# Patient Record
Sex: Male | Born: 1955
Health system: Southern US, Community
[De-identification: ages and names within clinical notes are randomized; demographics above are authoritative.]

## PROBLEM LIST (undated history)

## (undated) DIAGNOSIS — F99 Mental disorder, not otherwise specified: Secondary | ICD-10-CM

---

## 2001-12-22 ENCOUNTER — Ambulatory Visit (HOSPITAL_COMMUNITY): Admission: RE | Admit: 2001-12-22 | Discharge: 2001-12-22 | Payer: Self-pay | Admitting: Gastroenterology

## 2013-05-08 ENCOUNTER — Other Ambulatory Visit: Payer: Self-pay | Admitting: Endocrinology

## 2013-05-08 DIAGNOSIS — E0590 Thyrotoxicosis, unspecified without thyrotoxic crisis or storm: Secondary | ICD-10-CM

## 2013-05-15 ENCOUNTER — Ambulatory Visit
Admission: RE | Admit: 2013-05-15 | Discharge: 2013-05-15 | Disposition: A | Discharge status: Home / Self Care | Payer: BC Managed Care – PPO | Source: Ambulatory Visit | Attending: Endocrinology | Admitting: Endocrinology

## 2013-05-15 DIAGNOSIS — E0590 Thyrotoxicosis, unspecified without thyrotoxic crisis or storm: Secondary | ICD-10-CM

## 2013-05-25 ENCOUNTER — Other Ambulatory Visit (HOSPITAL_COMMUNITY): Payer: Self-pay | Admitting: Endocrinology

## 2013-05-25 DIAGNOSIS — E0590 Thyrotoxicosis, unspecified without thyrotoxic crisis or storm: Secondary | ICD-10-CM

## 2013-06-12 ENCOUNTER — Ambulatory Visit (HOSPITAL_COMMUNITY): Payer: BC Managed Care – PPO

## 2013-06-13 ENCOUNTER — Encounter (HOSPITAL_COMMUNITY): Payer: BC Managed Care – PPO

## 2013-06-13 ENCOUNTER — Encounter (HOSPITAL_COMMUNITY)
Admission: RE | Admit: 2013-06-13 | Discharge: 2013-06-13 | Disposition: A | Discharge status: Home / Self Care | Payer: BC Managed Care – PPO | Source: Ambulatory Visit | Attending: Endocrinology | Admitting: Endocrinology

## 2013-06-13 ENCOUNTER — Other Ambulatory Visit (HOSPITAL_COMMUNITY): Payer: BC Managed Care – PPO

## 2013-06-14 ENCOUNTER — Encounter (HOSPITAL_COMMUNITY): Payer: BC Managed Care – PPO

## 2015-09-16 DIAGNOSIS — E0590 Thyrotoxicosis, unspecified without thyrotoxic crisis or storm: Secondary | ICD-10-CM | POA: Diagnosis not present

## 2015-09-16 DIAGNOSIS — Z125 Encounter for screening for malignant neoplasm of prostate: Secondary | ICD-10-CM | POA: Diagnosis not present

## 2015-09-16 DIAGNOSIS — I444 Left anterior fascicular block: Secondary | ICD-10-CM | POA: Diagnosis not present

## 2015-09-22 DIAGNOSIS — R5383 Other fatigue: Secondary | ICD-10-CM | POA: Diagnosis not present

## 2015-09-22 DIAGNOSIS — E0590 Thyrotoxicosis, unspecified without thyrotoxic crisis or storm: Secondary | ICD-10-CM | POA: Diagnosis not present

## 2015-09-22 DIAGNOSIS — N3281 Overactive bladder: Secondary | ICD-10-CM | POA: Diagnosis not present

## 2015-09-22 DIAGNOSIS — Z0000 Encounter for general adult medical examination without abnormal findings: Secondary | ICD-10-CM | POA: Diagnosis not present

## 2015-09-22 DIAGNOSIS — R351 Nocturia: Secondary | ICD-10-CM | POA: Diagnosis not present

## 2015-09-22 DIAGNOSIS — I444 Left anterior fascicular block: Secondary | ICD-10-CM | POA: Diagnosis not present

## 2015-09-22 DIAGNOSIS — G250 Essential tremor: Secondary | ICD-10-CM | POA: Diagnosis not present

## 2015-09-22 DIAGNOSIS — L708 Other acne: Secondary | ICD-10-CM | POA: Diagnosis not present

## 2015-09-22 DIAGNOSIS — F79 Unspecified intellectual disabilities: Secondary | ICD-10-CM | POA: Diagnosis not present

## 2015-09-22 DIAGNOSIS — R634 Abnormal weight loss: Secondary | ICD-10-CM | POA: Diagnosis not present

## 2015-11-21 DIAGNOSIS — Z0000 Encounter for general adult medical examination without abnormal findings: Secondary | ICD-10-CM | POA: Diagnosis not present

## 2015-11-21 DIAGNOSIS — N3281 Overactive bladder: Secondary | ICD-10-CM | POA: Diagnosis not present

## 2015-11-21 DIAGNOSIS — R351 Nocturia: Secondary | ICD-10-CM | POA: Diagnosis not present

## 2016-08-10 DIAGNOSIS — R509 Fever, unspecified: Secondary | ICD-10-CM | POA: Diagnosis not present

## 2016-08-10 DIAGNOSIS — J111 Influenza due to unidentified influenza virus with other respiratory manifestations: Secondary | ICD-10-CM | POA: Diagnosis not present

## 2016-10-12 DIAGNOSIS — I444 Left anterior fascicular block: Secondary | ICD-10-CM | POA: Diagnosis not present

## 2016-10-12 DIAGNOSIS — E0590 Thyrotoxicosis, unspecified without thyrotoxic crisis or storm: Secondary | ICD-10-CM | POA: Diagnosis not present

## 2016-10-12 DIAGNOSIS — Z125 Encounter for screening for malignant neoplasm of prostate: Secondary | ICD-10-CM | POA: Diagnosis not present

## 2016-10-19 DIAGNOSIS — H9193 Unspecified hearing loss, bilateral: Secondary | ICD-10-CM | POA: Diagnosis not present

## 2016-10-19 DIAGNOSIS — Z0000 Encounter for general adult medical examination without abnormal findings: Secondary | ICD-10-CM | POA: Diagnosis not present

## 2016-10-19 DIAGNOSIS — G250 Essential tremor: Secondary | ICD-10-CM | POA: Diagnosis not present

## 2016-10-19 DIAGNOSIS — R634 Abnormal weight loss: Secondary | ICD-10-CM | POA: Diagnosis not present

## 2016-12-27 DIAGNOSIS — L218 Other seborrheic dermatitis: Secondary | ICD-10-CM | POA: Diagnosis not present

## 2016-12-27 DIAGNOSIS — L718 Other rosacea: Secondary | ICD-10-CM | POA: Diagnosis not present

## 2017-01-24 DIAGNOSIS — R351 Nocturia: Secondary | ICD-10-CM | POA: Diagnosis not present

## 2017-01-24 DIAGNOSIS — N3281 Overactive bladder: Secondary | ICD-10-CM | POA: Diagnosis not present

## 2017-03-04 DIAGNOSIS — R159 Full incontinence of feces: Secondary | ICD-10-CM | POA: Diagnosis not present

## 2017-03-04 DIAGNOSIS — R634 Abnormal weight loss: Secondary | ICD-10-CM | POA: Diagnosis not present

## 2017-03-04 DIAGNOSIS — R194 Change in bowel habit: Secondary | ICD-10-CM | POA: Diagnosis not present

## 2017-03-04 DIAGNOSIS — Z23 Encounter for immunization: Secondary | ICD-10-CM | POA: Diagnosis not present

## 2017-04-14 DIAGNOSIS — H1013 Acute atopic conjunctivitis, bilateral: Secondary | ICD-10-CM | POA: Diagnosis not present

## 2017-04-14 DIAGNOSIS — Z6823 Body mass index (BMI) 23.0-23.9, adult: Secondary | ICD-10-CM | POA: Diagnosis not present

## 2017-05-25 DIAGNOSIS — H1033 Unspecified acute conjunctivitis, bilateral: Secondary | ICD-10-CM | POA: Diagnosis not present

## 2017-05-30 DIAGNOSIS — H1033 Unspecified acute conjunctivitis, bilateral: Secondary | ICD-10-CM | POA: Diagnosis not present

## 2017-06-17 DIAGNOSIS — H1033 Unspecified acute conjunctivitis, bilateral: Secondary | ICD-10-CM | POA: Diagnosis not present

## 2017-09-02 DIAGNOSIS — L718 Other rosacea: Secondary | ICD-10-CM | POA: Diagnosis not present

## 2017-09-02 DIAGNOSIS — L570 Actinic keratosis: Secondary | ICD-10-CM | POA: Diagnosis not present

## 2017-09-02 DIAGNOSIS — L218 Other seborrheic dermatitis: Secondary | ICD-10-CM | POA: Diagnosis not present

## 2017-09-15 DIAGNOSIS — R634 Abnormal weight loss: Secondary | ICD-10-CM | POA: Diagnosis not present

## 2017-09-15 DIAGNOSIS — F79 Unspecified intellectual disabilities: Secondary | ICD-10-CM | POA: Diagnosis not present

## 2017-09-15 DIAGNOSIS — R159 Full incontinence of feces: Secondary | ICD-10-CM | POA: Diagnosis not present

## 2017-09-15 DIAGNOSIS — E0590 Thyrotoxicosis, unspecified without thyrotoxic crisis or storm: Secondary | ICD-10-CM | POA: Diagnosis not present

## 2017-11-07 DIAGNOSIS — L718 Other rosacea: Secondary | ICD-10-CM | POA: Diagnosis not present

## 2017-11-07 DIAGNOSIS — L218 Other seborrheic dermatitis: Secondary | ICD-10-CM | POA: Diagnosis not present

## 2017-11-08 DIAGNOSIS — L03031 Cellulitis of right toe: Secondary | ICD-10-CM | POA: Diagnosis not present

## 2017-11-08 DIAGNOSIS — L84 Corns and callosities: Secondary | ICD-10-CM | POA: Diagnosis not present

## 2017-11-08 DIAGNOSIS — I739 Peripheral vascular disease, unspecified: Secondary | ICD-10-CM | POA: Diagnosis not present

## 2017-11-08 DIAGNOSIS — L03032 Cellulitis of left toe: Secondary | ICD-10-CM | POA: Diagnosis not present

## 2017-11-22 DIAGNOSIS — E0590 Thyrotoxicosis, unspecified without thyrotoxic crisis or storm: Secondary | ICD-10-CM | POA: Diagnosis not present

## 2017-11-22 DIAGNOSIS — Z79899 Other long term (current) drug therapy: Secondary | ICD-10-CM | POA: Diagnosis not present

## 2017-11-22 DIAGNOSIS — Z125 Encounter for screening for malignant neoplasm of prostate: Secondary | ICD-10-CM | POA: Diagnosis not present

## 2017-11-22 DIAGNOSIS — R82998 Other abnormal findings in urine: Secondary | ICD-10-CM | POA: Diagnosis not present

## 2017-11-23 DIAGNOSIS — E0590 Thyrotoxicosis, unspecified without thyrotoxic crisis or storm: Secondary | ICD-10-CM | POA: Diagnosis not present

## 2017-11-23 DIAGNOSIS — Z0000 Encounter for general adult medical examination without abnormal findings: Secondary | ICD-10-CM | POA: Diagnosis not present

## 2017-11-23 DIAGNOSIS — H6122 Impacted cerumen, left ear: Secondary | ICD-10-CM | POA: Diagnosis not present

## 2017-11-23 DIAGNOSIS — H9192 Unspecified hearing loss, left ear: Secondary | ICD-10-CM | POA: Diagnosis not present

## 2018-02-09 DIAGNOSIS — H2513 Age-related nuclear cataract, bilateral: Secondary | ICD-10-CM | POA: Diagnosis not present

## 2018-03-23 DIAGNOSIS — H10023 Other mucopurulent conjunctivitis, bilateral: Secondary | ICD-10-CM | POA: Diagnosis not present

## 2018-08-01 DIAGNOSIS — Z0120 Encounter for dental examination and cleaning without abnormal findings: Secondary | ICD-10-CM | POA: Diagnosis not present

## 2018-12-14 DIAGNOSIS — E0590 Thyrotoxicosis, unspecified without thyrotoxic crisis or storm: Secondary | ICD-10-CM | POA: Diagnosis not present

## 2018-12-14 DIAGNOSIS — Z125 Encounter for screening for malignant neoplasm of prostate: Secondary | ICD-10-CM | POA: Diagnosis not present

## 2018-12-14 DIAGNOSIS — E7849 Other hyperlipidemia: Secondary | ICD-10-CM | POA: Diagnosis not present

## 2018-12-20 DIAGNOSIS — R82998 Other abnormal findings in urine: Secondary | ICD-10-CM | POA: Diagnosis not present

## 2018-12-20 DIAGNOSIS — R159 Full incontinence of feces: Secondary | ICD-10-CM | POA: Diagnosis not present

## 2018-12-20 DIAGNOSIS — I444 Left anterior fascicular block: Secondary | ICD-10-CM | POA: Diagnosis not present

## 2018-12-20 DIAGNOSIS — Z0000 Encounter for general adult medical examination without abnormal findings: Secondary | ICD-10-CM | POA: Diagnosis not present

## 2018-12-20 DIAGNOSIS — R351 Nocturia: Secondary | ICD-10-CM | POA: Diagnosis not present

## 2019-02-13 DIAGNOSIS — Z0120 Encounter for dental examination and cleaning without abnormal findings: Secondary | ICD-10-CM | POA: Diagnosis not present

## 2019-03-23 DIAGNOSIS — Z23 Encounter for immunization: Secondary | ICD-10-CM | POA: Diagnosis not present

## 2019-04-11 DIAGNOSIS — Z20828 Contact with and (suspected) exposure to other viral communicable diseases: Secondary | ICD-10-CM | POA: Diagnosis not present

## 2019-04-23 ENCOUNTER — Other Ambulatory Visit: Payer: Self-pay

## 2019-04-23 DIAGNOSIS — Z20822 Contact with and (suspected) exposure to covid-19: Secondary | ICD-10-CM

## 2019-04-25 LAB — NOVEL CORONAVIRUS, NAA: SARS-CoV-2, NAA: NOT DETECTED

## 2019-07-12 DIAGNOSIS — L011 Impetiginization of other dermatoses: Secondary | ICD-10-CM | POA: Diagnosis not present

## 2019-07-12 DIAGNOSIS — L249 Irritant contact dermatitis, unspecified cause: Secondary | ICD-10-CM | POA: Diagnosis not present

## 2019-08-17 DIAGNOSIS — L0109 Other impetigo: Secondary | ICD-10-CM | POA: Diagnosis not present

## 2019-08-17 DIAGNOSIS — L249 Irritant contact dermatitis, unspecified cause: Secondary | ICD-10-CM | POA: Diagnosis not present

## 2020-01-24 DIAGNOSIS — L011 Impetiginization of other dermatoses: Secondary | ICD-10-CM | POA: Diagnosis not present

## 2020-01-24 DIAGNOSIS — L239 Allergic contact dermatitis, unspecified cause: Secondary | ICD-10-CM | POA: Diagnosis not present

## 2020-01-24 DIAGNOSIS — L2084 Intrinsic (allergic) eczema: Secondary | ICD-10-CM | POA: Diagnosis not present

## 2020-02-01 DIAGNOSIS — E0590 Thyrotoxicosis, unspecified without thyrotoxic crisis or storm: Secondary | ICD-10-CM | POA: Diagnosis not present

## 2020-02-01 DIAGNOSIS — E7849 Other hyperlipidemia: Secondary | ICD-10-CM | POA: Diagnosis not present

## 2020-02-01 DIAGNOSIS — R7301 Impaired fasting glucose: Secondary | ICD-10-CM | POA: Diagnosis not present

## 2020-02-01 DIAGNOSIS — Z0000 Encounter for general adult medical examination without abnormal findings: Secondary | ICD-10-CM | POA: Diagnosis not present

## 2020-02-04 DIAGNOSIS — R82998 Other abnormal findings in urine: Secondary | ICD-10-CM | POA: Diagnosis not present

## 2020-02-08 ENCOUNTER — Other Ambulatory Visit: Payer: Self-pay | Admitting: Internal Medicine

## 2020-02-08 DIAGNOSIS — Z0000 Encounter for general adult medical examination without abnormal findings: Secondary | ICD-10-CM | POA: Diagnosis not present

## 2020-02-08 DIAGNOSIS — E785 Hyperlipidemia, unspecified: Secondary | ICD-10-CM

## 2020-02-08 DIAGNOSIS — Z1212 Encounter for screening for malignant neoplasm of rectum: Secondary | ICD-10-CM | POA: Diagnosis not present

## 2020-02-08 DIAGNOSIS — R5383 Other fatigue: Secondary | ICD-10-CM | POA: Diagnosis not present

## 2020-02-08 DIAGNOSIS — R32 Unspecified urinary incontinence: Secondary | ICD-10-CM | POA: Diagnosis not present

## 2020-02-08 DIAGNOSIS — R7301 Impaired fasting glucose: Secondary | ICD-10-CM | POA: Diagnosis not present

## 2020-02-27 ENCOUNTER — Ambulatory Visit
Admission: RE | Admit: 2020-02-27 | Discharge: 2020-02-27 | Disposition: A | Discharge status: Home / Self Care | Payer: No Typology Code available for payment source | Source: Ambulatory Visit | Attending: Internal Medicine | Admitting: Internal Medicine

## 2020-02-27 DIAGNOSIS — E785 Hyperlipidemia, unspecified: Secondary | ICD-10-CM

## 2020-04-15 DIAGNOSIS — H5213 Myopia, bilateral: Secondary | ICD-10-CM | POA: Diagnosis not present

## 2020-04-15 DIAGNOSIS — H2513 Age-related nuclear cataract, bilateral: Secondary | ICD-10-CM | POA: Diagnosis not present

## 2020-05-05 DIAGNOSIS — J029 Acute pharyngitis, unspecified: Secondary | ICD-10-CM | POA: Diagnosis not present

## 2020-05-05 DIAGNOSIS — R5383 Other fatigue: Secondary | ICD-10-CM | POA: Diagnosis not present

## 2020-05-05 DIAGNOSIS — N3001 Acute cystitis with hematuria: Secondary | ICD-10-CM | POA: Diagnosis not present

## 2020-05-05 DIAGNOSIS — N390 Urinary tract infection, site not specified: Secondary | ICD-10-CM | POA: Diagnosis not present

## 2020-05-05 DIAGNOSIS — D72829 Elevated white blood cell count, unspecified: Secondary | ICD-10-CM | POA: Diagnosis not present

## 2020-05-07 ENCOUNTER — Inpatient Hospital Stay (HOSPITAL_COMMUNITY)
Admission: EM | Admit: 2020-05-07 | Discharge: 2020-05-12 | DRG: 871 | Disposition: A | Discharge status: Home Health | Payer: Medicare Other | Attending: Internal Medicine | Admitting: Internal Medicine

## 2020-05-07 ENCOUNTER — Other Ambulatory Visit: Payer: Self-pay

## 2020-05-07 ENCOUNTER — Emergency Department (HOSPITAL_COMMUNITY): Payer: Medicare Other

## 2020-05-07 ENCOUNTER — Encounter (HOSPITAL_COMMUNITY): Payer: Self-pay

## 2020-05-07 DIAGNOSIS — R338 Other retention of urine: Secondary | ICD-10-CM | POA: Diagnosis not present

## 2020-05-07 DIAGNOSIS — R32 Unspecified urinary incontinence: Secondary | ICD-10-CM | POA: Diagnosis present

## 2020-05-07 DIAGNOSIS — R6520 Severe sepsis without septic shock: Secondary | ICD-10-CM | POA: Diagnosis not present

## 2020-05-07 DIAGNOSIS — E872 Acidosis: Secondary | ICD-10-CM | POA: Diagnosis present

## 2020-05-07 DIAGNOSIS — N400 Enlarged prostate without lower urinary tract symptoms: Secondary | ICD-10-CM | POA: Diagnosis not present

## 2020-05-07 DIAGNOSIS — R748 Abnormal levels of other serum enzymes: Secondary | ICD-10-CM | POA: Diagnosis not present

## 2020-05-07 DIAGNOSIS — F79 Unspecified intellectual disabilities: Secondary | ICD-10-CM | POA: Diagnosis present

## 2020-05-07 DIAGNOSIS — F419 Anxiety disorder, unspecified: Secondary | ICD-10-CM | POA: Diagnosis present

## 2020-05-07 DIAGNOSIS — N138 Other obstructive and reflux uropathy: Secondary | ICD-10-CM | POA: Diagnosis present

## 2020-05-07 DIAGNOSIS — N139 Obstructive and reflux uropathy, unspecified: Secondary | ICD-10-CM

## 2020-05-07 DIAGNOSIS — R000 Tachycardia, unspecified: Secondary | ICD-10-CM | POA: Diagnosis not present

## 2020-05-07 DIAGNOSIS — R5381 Other malaise: Secondary | ICD-10-CM | POA: Diagnosis not present

## 2020-05-07 DIAGNOSIS — Z20822 Contact with and (suspected) exposure to covid-19: Secondary | ICD-10-CM | POA: Diagnosis present

## 2020-05-07 DIAGNOSIS — N19 Unspecified kidney failure: Secondary | ICD-10-CM | POA: Diagnosis not present

## 2020-05-07 DIAGNOSIS — R6521 Severe sepsis with septic shock: Secondary | ICD-10-CM | POA: Diagnosis present

## 2020-05-07 DIAGNOSIS — N390 Urinary tract infection, site not specified: Secondary | ICD-10-CM | POA: Diagnosis not present

## 2020-05-07 DIAGNOSIS — R911 Solitary pulmonary nodule: Secondary | ICD-10-CM | POA: Diagnosis not present

## 2020-05-07 DIAGNOSIS — N401 Enlarged prostate with lower urinary tract symptoms: Secondary | ICD-10-CM | POA: Diagnosis not present

## 2020-05-07 DIAGNOSIS — A419 Sepsis, unspecified organism: Secondary | ICD-10-CM | POA: Diagnosis not present

## 2020-05-07 DIAGNOSIS — K7689 Other specified diseases of liver: Secondary | ICD-10-CM | POA: Diagnosis not present

## 2020-05-07 DIAGNOSIS — F32A Depression, unspecified: Secondary | ICD-10-CM | POA: Diagnosis not present

## 2020-05-07 DIAGNOSIS — R7989 Other specified abnormal findings of blood chemistry: Secondary | ICD-10-CM | POA: Diagnosis not present

## 2020-05-07 DIAGNOSIS — R945 Abnormal results of liver function studies: Secondary | ICD-10-CM | POA: Diagnosis not present

## 2020-05-07 DIAGNOSIS — N170 Acute kidney failure with tubular necrosis: Secondary | ICD-10-CM | POA: Diagnosis present

## 2020-05-07 DIAGNOSIS — N179 Acute kidney failure, unspecified: Secondary | ICD-10-CM | POA: Diagnosis not present

## 2020-05-07 HISTORY — DX: Mental disorder, not otherwise specified: F99

## 2020-05-07 LAB — CBC WITH DIFFERENTIAL/PLATELET
Abs Immature Granulocytes: 0.68 10*3/uL — ABNORMAL HIGH (ref 0.00–0.07)
Basophils Absolute: 0.1 10*3/uL (ref 0.0–0.1)
Basophils Relative: 1 %
Eosinophils Absolute: 0 10*3/uL (ref 0.0–0.5)
Eosinophils Relative: 0 %
HCT: 46.9 % (ref 39.0–52.0)
Hemoglobin: 15.1 g/dL (ref 13.0–17.0)
Immature Granulocytes: 4 %
Lymphocytes Relative: 5 %
Lymphs Abs: 0.8 10*3/uL (ref 0.7–4.0)
MCH: 30.9 pg (ref 26.0–34.0)
MCHC: 32.2 g/dL (ref 30.0–36.0)
MCV: 95.9 fL (ref 80.0–100.0)
Monocytes Absolute: 2.2 10*3/uL — ABNORMAL HIGH (ref 0.1–1.0)
Monocytes Relative: 12 %
Neutro Abs: 14.6 10*3/uL — ABNORMAL HIGH (ref 1.7–7.7)
Neutrophils Relative %: 78 %
Platelets: 145 10*3/uL — ABNORMAL LOW (ref 150–400)
RBC: 4.89 MIL/uL (ref 4.22–5.81)
RDW: 13.7 % (ref 11.5–15.5)
WBC: 18.3 10*3/uL — ABNORMAL HIGH (ref 4.0–10.5)
nRBC: 0 % (ref 0.0–0.2)

## 2020-05-07 LAB — RESP PANEL BY RT PCR (RSV, FLU A&B, COVID)
Influenza A by PCR: NEGATIVE
Influenza B by PCR: NEGATIVE
Respiratory Syncytial Virus by PCR: NEGATIVE
SARS Coronavirus 2 by RT PCR: NEGATIVE

## 2020-05-07 LAB — COMPREHENSIVE METABOLIC PANEL
ALT: 52 U/L — ABNORMAL HIGH (ref 0–44)
AST: 42 U/L — ABNORMAL HIGH (ref 15–41)
Albumin: 3.1 g/dL — ABNORMAL LOW (ref 3.5–5.0)
Alkaline Phosphatase: 111 U/L (ref 38–126)
Anion gap: 16 — ABNORMAL HIGH (ref 5–15)
BUN: 83 mg/dL — ABNORMAL HIGH (ref 8–23)
CO2: 20 mmol/L — ABNORMAL LOW (ref 22–32)
Calcium: 8.8 mg/dL — ABNORMAL LOW (ref 8.9–10.3)
Chloride: 97 mmol/L — ABNORMAL LOW (ref 98–111)
Creatinine, Ser: 2.82 mg/dL — ABNORMAL HIGH (ref 0.61–1.24)
GFR, Estimated: 24 mL/min — ABNORMAL LOW (ref >60)
Glucose, Bld: 134 mg/dL — ABNORMAL HIGH (ref 70–99)
Potassium: 4.1 mmol/L (ref 3.5–5.1)
Sodium: 133 mmol/L — ABNORMAL LOW (ref 135–145)
Total Bilirubin: 0.9 mg/dL (ref 0.3–1.2)
Total Protein: 6.7 g/dL (ref 6.5–8.1)

## 2020-05-07 LAB — URINALYSIS, COMPLETE (UACMP) WITH MICROSCOPIC
Bilirubin Urine: NEGATIVE
Glucose, UA: NEGATIVE mg/dL
Ketones, ur: NEGATIVE mg/dL
Nitrite: NEGATIVE
Protein, ur: NEGATIVE mg/dL
Specific Gravity, Urine: 1.016 (ref 1.005–1.030)
pH: 5 (ref 5.0–8.0)

## 2020-05-07 LAB — LACTIC ACID, PLASMA
Lactic Acid, Venous: 4 mmol/L (ref 0.5–1.9)
Lactic Acid, Venous: 2 mmol/L (ref 0.5–1.9)

## 2020-05-07 LAB — APTT: aPTT: 27 seconds (ref 24–36)

## 2020-05-07 LAB — PROTIME-INR
INR: 1.2 (ref 0.8–1.2)
Prothrombin Time: 14.3 seconds (ref 11.4–15.2)

## 2020-05-07 MED ORDER — SODIUM CHLORIDE 0.9 % IV BOLUS (SEPSIS)
1000.0000 mL | Freq: Once | INTRAVENOUS | Status: AC
  Administered 2020-05-07: 1000 mL via INTRAVENOUS

## 2020-05-07 MED ORDER — ACETAMINOPHEN 500 MG PO TABS
1000.0000 mg | ORAL_TABLET | Freq: Once | ORAL | Status: AC
  Administered 2020-05-07: 1000 mg via ORAL
  Filled 2020-05-07: qty 2

## 2020-05-07 MED ORDER — SODIUM CHLORIDE 0.9 % IV SOLN
1.0000 g | INTRAVENOUS | Status: DC
  Administered 2020-05-07 – 2020-05-11 (×5): 1 g via INTRAVENOUS
  Filled 2020-05-07 (×5): qty 10

## 2020-05-07 MED ORDER — SODIUM CHLORIDE 0.9 % IV BOLUS (SEPSIS)
250.0000 mL | Freq: Once | INTRAVENOUS | Status: AC
  Administered 2020-05-07: 250 mL via INTRAVENOUS

## 2020-05-07 MED ORDER — LACTATED RINGERS IV SOLN: INTRAVENOUS | Status: DC

## 2020-05-07 NOTE — ED Provider Notes (Signed)
[facility]-EMERGENCY DEPT Provider Note   CSN: [id number] Arrival date & time: 05/07/20  1745     History Chief Complaint  Patient presents with  . Abnormal Lab    Paul Moreno is a 64 y.o. male with a past medical history of developmental delay.  He is attended by his sister who gives the history reviewed. The patient lives in the group home.  He is vaccinated against the coronavirus.  She states that she meets him at church every Sunday and noticed that he seemed extremely weak.  He has not been eating or drinking well and has had shaking chills.  She states that he has not had a reported fever at his group home.  She noticed that he seemed extremely weak however he frequently will answer that he feels okay even if he is in significant pain.  He was seen by Dr. Waynard Edwards this past Monday who took lab work and called him to come for evaluation due to elevated creatinine and white blood cell count of 38,000.  Patient is unable to provide any further history HPI     Past Medical History:  Diagnosis Date  . Mental disorder     There are no problems to display for this patient.   History reviewed. No pertinent surgical history.     Family History  Problem Relation Age of Onset  . Diabetes Father   . Heart failure Father     Social History   Tobacco Use  . Smoking status: Never Smoker  . Smokeless tobacco: Never Used  Vaping Use  . Vaping Use: Never used  Substance Use Topics  . Alcohol use: Never  . Drug use: Never    Home Medications Prior to Admission medications   Medication Sig Start Date End Date Taking? Authorizing Provider  acetaminophen (TYLENOL) 500 MG tablet Take 500 mg by mouth every 6 (six) hours as needed for moderate pain.   Yes [provider]  augmented betamethasone dipropionate (DIPROLENE-AF) 0.05 % cream Apply 1 application topically 2 (two) times daily as needed (dry skin).  04/04/20  Yes [provider]   cefdinir (OMNICEF) 300 MG capsule Take 300 mg by mouth 2 (two) times daily.  05/05/20  Yes [provider]  fluticasone (FLONASE) 50 MCG/ACT nasal spray Place 1 spray into both nostrils daily.  04/12/20  Yes [provider]  ketoconazole (NIZORAL) 2 % cream Apply 1 application topically at bedtime.  05/07/20  Yes [provider]  ketoconazole (NIZORAL) 2 % shampoo Apply 1 application topically 3 (three) times a week. On MWF 05/07/20  Yes [provider]  metroNIDAZOLE (METROCREAM) 0.75 % cream Apply 1 application topically at bedtime.   Yes [provider]  mirtazapine (REMERON) 15 MG tablet Take 15 mg by mouth at bedtime. 05/01/20  Yes [provider]  MYRBETRIQ 50 MG TB24 tablet Take 50 mg by mouth daily. 05/01/20  Yes [provider]  OLANZapine (ZYPREXA) 2.5 MG tablet Take 2.5 mg by mouth at bedtime. 05/01/20  Yes [provider]  tamsulosin (FLOMAX) 0.4 MG CAPS capsule Take 0.4 mg by mouth daily. 05/01/20  Yes [provider]  TOVIAZ 4 MG TB24 tablet Take 4 mg by mouth daily. 05/01/20  Yes [provider]    Allergies    Patient has no known allergies.  Review of Systems   Review of Systems Ten systems reviewed and are negative for acute change, except as noted in the HPI.  Physical  Exam Updated Vital Signs BP 102/77   Pulse 98   Temp (!) 100.5 F (38.1 C) (Rectal)   Resp 18   Ht 5\' 10"  (1.778 m)   Wt 72.6 kg   SpO2 95%   BMI 22.96 kg/m   Physical Exam Vitals and nursing note reviewed.  Constitutional:      General: He is not in acute distress.    Appearance: He is well-developed. He is ill-appearing. He is not toxic-appearing or diaphoretic.     Comments: Patient appears pale  HENT:     Head: Normocephalic and atraumatic.  Eyes:     General: No scleral icterus.    Conjunctiva/sclera: Conjunctivae normal.  Cardiovascular:     Rate and Rhythm: Normal rate and regular rhythm.      Heart sounds: Normal heart sounds.  Pulmonary:     Effort: Pulmonary effort is normal. No respiratory distress.     Breath sounds: Normal breath sounds.  Abdominal:     General: There is distension.     Palpations: Abdomen is soft.     Tenderness: There is no abdominal tenderness.     Comments: Lower abdomen is firm and distended.  Suspect bladder distention.  Musculoskeletal:     Cervical back: Normal range of motion and neck supple.  Skin:    General: Skin is warm and dry.  Neurological:     Mental Status: He is alert.  Psychiatric:        Behavior: Behavior normal.     ED Results / Procedures / Treatments   Labs (all labs ordered are listed, but only abnormal results are displayed) Labs Reviewed  LACTIC ACID, PLASMA - Abnormal; Notable for the following components:      Result Value   Lactic Acid, Venous 4.0 (*)    All other components within normal limits  LACTIC ACID, PLASMA - Abnormal; Notable for the following components:   Lactic Acid, Venous 2.0 (*)    All other components within normal limits  COMPREHENSIVE METABOLIC PANEL - Abnormal; Notable for the following components:   Sodium 133 (*)    Chloride 97 (*)    CO2 20 (*)    Glucose, Bld 134 (*)    BUN 83 (*)    Creatinine, Ser 2.82 (*)    Calcium 8.8 (*)    Albumin 3.1 (*)    AST 42 (*)    ALT 52 (*)    GFR, Estimated 24 (*)    Anion gap 16 (*)    All other components within normal limits  CBC WITH DIFFERENTIAL/PLATELET - Abnormal; Notable for the following components:   WBC 18.3 (*)    Platelets 145 (*)    Neutro Abs 14.6 (*)    Monocytes Absolute 2.2 (*)    Abs Immature Granulocytes 0.68 (*)    All other components within normal limits  URINALYSIS, COMPLETE (UACMP) WITH MICROSCOPIC - Abnormal; Notable for the following components:   APPearance HAZY (*)    Hgb urine dipstick MODERATE (*)    Leukocytes,Ua SMALL (*)    Bacteria, UA FEW (*)    All other components within normal limits  RESP PANEL BY  RT PCR (RSV, FLU A&B, COVID)  CULTURE, BLOOD (ROUTINE X 2)  CULTURE, BLOOD (ROUTINE X 2)  URINE CULTURE  PROTIME-INR  APTT  URINALYSIS, ROUTINE W REFLEX MICROSCOPIC    EKG EKG Interpretation  Date/Time:  Wednesday May 07 2020 19:25:02 EDT Ventricular Rate:  103 PR Interval:    QRS  Duration: 116 QT Interval:  333 QTC Calculation: 436 R Axis:   -67 Text Interpretation: Sinus tachycardia Left anterior fascicular block Left ventricular hypertrophy No old tracing to compare Confirmed by Jacalyn Lefevre [phone]) on 05/07/2020 7:28:13 PM   Radiology DG Chest Port 1 View  Result Date: 05/07/2020 CLINICAL DATA:  Elevated white blood cell count EXAM: PORTABLE CHEST 1 VIEW COMPARISON:  02/27/2020 FINDINGS: Single frontal view of the chest demonstrates an unremarkable cardiac silhouette. No airspace disease, effusion, or pneumothorax. The 8 mm left lower lobe pulmonary nodule seen on recent chest CT is not visible by radiography. No acute bony abnormalities. IMPRESSION: 1. No acute intrathoracic process. 2. Previously identified 8 mm left lower lobe pulmonary nodule not identified by radiograph. Please refer to previous CT report for recommendations regarding follow-up. Electronically Signed   By: Sharlet Salina M.D.   On: 05/07/2020 19:52   CT Renal Stone Study  Result Date: 05/07/2020 CLINICAL DATA:  Renal failure.  Acute urinary retention EXAM: CT ABDOMEN AND PELVIS WITHOUT CONTRAST TECHNIQUE: Multidetector CT imaging of the abdomen and pelvis was performed following the standard protocol without IV contrast. COMPARISON:  None. FINDINGS: Lower chest: Bibasilar atelectasis. Hepatobiliary: Scattered hypodensities in the liver difficult to characterize on this noncontrast study, but favor cysts. Gallbladder unremarkable. Pancreas: No focal abnormality or ductal dilatation. Spleen: No focal abnormality.  Normal size. Adrenals/Urinary Tract: Foley catheter present in the bladder which is  decompressed. No hydronephrosis. No renal or adrenal mass. Stomach/Bowel: Stomach, large and small bowel grossly unremarkable. Normal appendix. Vascular/Lymphatic: No evidence of aneurysm or adenopathy. Reproductive: Prostate enlargement with a transverse diameter of 6.6 cm. Central calcifications. Other: No free fluid or free air. Musculoskeletal: No acute bony abnormality. IMPRESSION: Decompression a bladder with Foley catheter in place. No hydronephrosis. Scattered hypodensities in the liver, favor cysts, but cannot be characterized on this noncontrast study. Bibasilar atelectasis. Prostate enlargement. Electronically Signed   By: Charlett Nose M.D.   On: 05/07/2020 20:40    Procedures Procedures (including critical care time)  Medications Ordered in ED Medications  cefTRIAXone (ROCEPHIN) 1 g in sodium chloride 0.9 % 100 mL IVPB (1 g Intravenous New Bag/Given 05/07/20 2114)  sodium chloride 0.9 % bolus 1,000 mL (1,000 mLs Intravenous New Bag/Given 05/07/20 2113)    And  sodium chloride 0.9 % bolus 1,000 mL (1,000 mLs Intravenous New Bag/Given 05/07/20 2114)    And  sodium chloride 0.9 % bolus 250 mL (0 mLs Intravenous Stopped 05/07/20 2134)  acetaminophen (TYLENOL) tablet 1,000 mg (1,000 mg Oral Given 05/07/20 2115)    ED Course  I have reviewed the triage vital signs and the nursing notes.  Pertinent labs & imaging results that were available during my care of the patient were reviewed by me and considered in my medical decision making (see chart for details).    MDM Rules/Calculators/A&P                          FO:YDXAJOIN VS: BP 102/77   Pulse 98   Temp (!) 100.5 F (38.1 C) (Rectal)   Resp 18   Ht 5\' 10"  (1.778 m)   Wt 72.6 kg   SpO2 95%   BMI 22.96 kg/m   is gathered by patient and emr. Previous records obtained and reviewed. DDX:The patient's complaint of weakness involves an extensive number of diagnostic and treatment options, and is a complaint that carries  with it a high risk of complications, morbidity,  and potential mortality. Given the large differential diagnosis, medical decision making is of high complexity. The differential diagnosis of weakness includes but is not limited to neurologic causes (GBS, myasthenia gravis, CVA, MS, ALS, transverse myelitis, spinal cord injury, CVA, botulism, ) and other causes: ACS, Arrhythmia, syncope, orthostatic hypotension, sepsis, hypoglycemia, electrolyte disturbance, hypothyroidism, respiratory failure, symptomatic anemia, dehydration, heat injury, polypharmacy, malignancy.  Labs: I ordered reviewed and interpreted labs which include CBC which shows a white blood cell count of 18,000 with low platelets, urinalysis is positive for infection.  CMP shows elevated creatinine at 2.82 with BUN of 83.  Unknown baseline creatinine however he was sent in from his PCP for elevated creatinine so we can assume that this is likely much higher than his baseline.  Patient's lactic acid 4.0 and trending down after fluids to 2.0.  Respiratory panel negative for Covid,'s RSV or influenza.  PT/INR within normal limits. Imaging: I ordered and reviewed images which included CT renal stone study and portable cxr . I independently visualized and interpreted all imaging.  There are no acute, significant findings on today's images.  EKG: sinus tachycardia at a rate of 103 Consults: MDM: Patient here with UTI, acute urinary retention with 1700 ml Urine in the bladder. The patient had some BPH on CT scan but negative for overt cause obstruction. The patient meets sepsis criteria with UTI, fever , tachycardia and elevated WBC count along with AKI and lactic acidosis. He feels greatly relieved after insertion of foley catheter.  Patient disposition:The patient appears reasonably screened and/or stabilized for discharge and I doubt any other medical condition or other [facility] requiring further screening, evaluation, or treatment in the ED at this  time prior to discharge. I have discussed lab and/or imaging findings with the patient and answered all questions/concerns to the best of my ability.I have discussed return precautions and OP follow up.    Final Clinical Impression(s) / ED Diagnoses Final diagnoses:  Sepsis due to urinary tract infection (HCC)  Acute urinary retention  Obstructive uropathy    Rx / DC Orders ED Discharge Orders    None       Arthor CaptainHarris, Margree Gimbel, PA-C 05/09/20 1754    Jacalyn LefevreHaviland, Julie, MD 05/10/20 2308

## 2020-05-07 NOTE — ED Notes (Signed)
Patient transported to CT 

## 2020-05-07 NOTE — ED Triage Notes (Signed)
Patient was called by PCP and WBC- 38,000 and elevated Creatinine.

## 2020-05-07 NOTE — ED Notes (Signed)
Bladder scan volume >999mL 

## 2020-05-08 DIAGNOSIS — N390 Urinary tract infection, site not specified: Secondary | ICD-10-CM | POA: Diagnosis present

## 2020-05-08 DIAGNOSIS — R338 Other retention of urine: Secondary | ICD-10-CM | POA: Diagnosis present

## 2020-05-08 DIAGNOSIS — R7989 Other specified abnormal findings of blood chemistry: Secondary | ICD-10-CM | POA: Diagnosis not present

## 2020-05-08 DIAGNOSIS — N170 Acute kidney failure with tubular necrosis: Secondary | ICD-10-CM | POA: Diagnosis present

## 2020-05-08 DIAGNOSIS — F79 Unspecified intellectual disabilities: Secondary | ICD-10-CM | POA: Diagnosis present

## 2020-05-08 DIAGNOSIS — R6521 Severe sepsis with septic shock: Secondary | ICD-10-CM | POA: Diagnosis present

## 2020-05-08 DIAGNOSIS — F32A Depression, unspecified: Secondary | ICD-10-CM | POA: Diagnosis present

## 2020-05-08 DIAGNOSIS — N139 Obstructive and reflux uropathy, unspecified: Secondary | ICD-10-CM | POA: Diagnosis present

## 2020-05-08 DIAGNOSIS — A419 Sepsis, unspecified organism: Secondary | ICD-10-CM | POA: Diagnosis present

## 2020-05-08 DIAGNOSIS — N179 Acute kidney failure, unspecified: Secondary | ICD-10-CM | POA: Diagnosis not present

## 2020-05-08 DIAGNOSIS — Z20822 Contact with and (suspected) exposure to covid-19: Secondary | ICD-10-CM | POA: Diagnosis present

## 2020-05-08 DIAGNOSIS — R32 Unspecified urinary incontinence: Secondary | ICD-10-CM | POA: Diagnosis present

## 2020-05-08 DIAGNOSIS — F419 Anxiety disorder, unspecified: Secondary | ICD-10-CM | POA: Diagnosis present

## 2020-05-08 DIAGNOSIS — N401 Enlarged prostate with lower urinary tract symptoms: Secondary | ICD-10-CM | POA: Diagnosis present

## 2020-05-08 DIAGNOSIS — E872 Acidosis: Secondary | ICD-10-CM | POA: Diagnosis present

## 2020-05-08 DIAGNOSIS — N138 Other obstructive and reflux uropathy: Secondary | ICD-10-CM | POA: Diagnosis present

## 2020-05-08 LAB — BASIC METABOLIC PANEL
Anion gap: 8 (ref 5–15)
BUN: 41 mg/dL — ABNORMAL HIGH (ref 8–23)
CO2: 22 mmol/L (ref 22–32)
Calcium: 7.7 mg/dL — ABNORMAL LOW (ref 8.9–10.3)
Chloride: 109 mmol/L (ref 98–111)
Creatinine, Ser: 1.13 mg/dL (ref 0.61–1.24)
GFR, Estimated: >60 mL/min (ref >60)
Glucose, Bld: 89 mg/dL (ref 70–99)
Potassium: 3.7 mmol/L (ref 3.5–5.1)
Sodium: 139 mmol/L (ref 135–145)

## 2020-05-08 LAB — CBC
HCT: 40.5 % (ref 39.0–52.0)
Hemoglobin: 13.8 g/dL (ref 13.0–17.0)
MCH: 31.7 pg (ref 26.0–34.0)
MCHC: 34.1 g/dL (ref 30.0–36.0)
MCV: 93.1 fL (ref 80.0–100.0)
Platelets: 96 10*3/uL — ABNORMAL LOW (ref 150–400)
RBC: 4.35 MIL/uL (ref 4.22–5.81)
RDW: 13.5 % (ref 11.5–15.5)
WBC: 11.1 10*3/uL — ABNORMAL HIGH (ref 4.0–10.5)
nRBC: 0 % (ref 0.0–0.2)

## 2020-05-08 LAB — HIV ANTIBODY (ROUTINE TESTING W REFLEX): HIV Screen 4th Generation wRfx: NONREACTIVE

## 2020-05-08 MED ORDER — ACETAMINOPHEN 650 MG RE SUPP: 650.0000 mg | Freq: Four times a day (QID) | RECTAL | Status: DC | PRN

## 2020-05-08 MED ORDER — SODIUM CHLORIDE 0.9 % IV SOLN: INTRAVENOUS | Status: DC

## 2020-05-08 MED ORDER — FLUTICASONE PROPIONATE 50 MCG/ACT NA SUSP
1.0000 | Freq: Every day | NASAL | Status: DC
  Administered 2020-05-08 – 2020-05-12 (×5): 1 via NASAL
  Filled 2020-05-08: qty 16

## 2020-05-08 MED ORDER — TAMSULOSIN HCL 0.4 MG PO CAPS
0.4000 mg | ORAL_CAPSULE | Freq: Every day | ORAL | Status: DC
  Administered 2020-05-08 – 2020-05-12 (×5): 0.4 mg via ORAL
  Filled 2020-05-08 (×5): qty 1

## 2020-05-08 MED ORDER — MIRTAZAPINE 15 MG PO TABS
15.0000 mg | ORAL_TABLET | Freq: Every day | ORAL | Status: DC
  Administered 2020-05-08 – 2020-05-11 (×5): 15 mg via ORAL
  Filled 2020-05-08 (×3): qty 1
  Filled 2020-05-08: qty 2
  Filled 2020-05-08: qty 1

## 2020-05-08 MED ORDER — ACETAMINOPHEN 325 MG PO TABS: 650.0000 mg | ORAL_TABLET | Freq: Four times a day (QID) | ORAL | Status: DC | PRN

## 2020-05-08 MED ORDER — HEPARIN SODIUM (PORCINE) 5000 UNIT/ML IJ SOLN
5000.0000 [IU] | Freq: Three times a day (TID) | INTRAMUSCULAR | Status: DC
  Administered 2020-05-08 – 2020-05-12 (×13): 5000 [IU] via SUBCUTANEOUS
  Filled 2020-05-08 (×12): qty 1

## 2020-05-08 MED ORDER — SODIUM CHLORIDE 0.9 % IV SOLN: 1.0000 g | INTRAVENOUS | Status: DC

## 2020-05-08 MED ORDER — CHLORHEXIDINE GLUCONATE CLOTH 2 % EX PADS
6.0000 | MEDICATED_PAD | Freq: Every day | CUTANEOUS | Status: DC
  Administered 2020-05-08 – 2020-05-12 (×5): 6 via TOPICAL

## 2020-05-08 MED ORDER — ONDANSETRON HCL 4 MG/2ML IJ SOLN: 4.0000 mg | Freq: Four times a day (QID) | INTRAMUSCULAR | Status: DC | PRN

## 2020-05-08 MED ORDER — ONDANSETRON HCL 4 MG PO TABS: 4.0000 mg | ORAL_TABLET | Freq: Four times a day (QID) | ORAL | Status: DC | PRN

## 2020-05-08 MED ORDER — MIRABEGRON ER 25 MG PO TB24
50.0000 mg | ORAL_TABLET | Freq: Every day | ORAL | Status: DC
  Administered 2020-05-08: 50 mg via ORAL
  Filled 2020-05-08 (×3): qty 2

## 2020-05-08 MED ORDER — OLANZAPINE 5 MG PO TABS
2.5000 mg | ORAL_TABLET | Freq: Every day | ORAL | Status: DC
  Administered 2020-05-08 – 2020-05-11 (×5): 2.5 mg via ORAL
  Filled 2020-05-08 (×5): qty 1

## 2020-05-08 NOTE — Plan of Care (Signed)
  Problem: Urinary Elimination: Goal: Signs and symptoms of infection will decrease Outcome: Progressing   

## 2020-05-08 NOTE — H&P (Signed)
History and Physical    Paul Moreno NFA:[id number] DOB: 10-Jun-1956 DOA: 05/07/2020  PCP: [name], MD (Confirm with patient/family/NH records and if not entered, this has to be entered at [facility] point of entry) Patient coming from: Group home  I have personally briefly reviewed patient's old medical records in [facility]  Chief Complaint: Abnormal labs  HPI: Paul Moreno is a 64 y.o. male with medical history significant of mental retardation, anxiety/depression and urinary problems who presented to ED for evaluation of abnormal labs including elevated troponin and leukocytosis.  Patient is unable to provide any history but states that he is feeling weak and tired and also admits of having mild suprapubic tenderness.  As per ED physician, patient's sister reported that patient is not eating and drinking well recently and he was seen by his PCP on Monday who did the lab work found that patient had elevated creatinine and leukocytosis.  Patient otherwise denies fever, chills, chest pain, shortness of breath, nausea, vomiting, abdominal pain and urinary symptoms.  Patient is most probably saying no to every question because of his mental retardation.  (For level 3, the HPI must include 4+ descriptors: Location, Quality, Severity, Duration, Timing, Context, modifying factors, associated signs/symptoms and/or status of 3+ chronic problems.)  (Please avoid self-populating past medical history here) (The initial 2-3 lines should be focused and good to copy and paste in the HPI section of the daily progress note).  ED Course: On arrival to ED patient had blood pressure of 102/77, heart rate 98, temperature 100.5, respiratory rate 18 and oxygen saturation 95% on room air.  Blood work showed leukocytosis with WBC count of 18.3, BUN 83 and creatinine 2.82.  Lactic acid was 4 while UA was positive for UTI.  Respiratory panel was negative for COVID-19, influenza and RSV.  Patient was found to have large  amount of urinary retention that is removed after placing a Foley catheter.  CT scan showed some BPH.  Patient was meeting the sepsis criteria secondary to UTI.  Patient was managed with IV fluids and started on IV ceftriaxone.     Review of Systems: As per HPI otherwise 10 point review of systems negative.  Unacceptable ROS statements: "10 systems reviewed," "Extensive" (without elaboration).  Acceptable ROS statements: "All others negative," "All others reviewed and are negative," and "All others unremarkable," with at LEAST ONE ROS documented Can't double dip - if using for HPI can't use for ROS  Past Medical History:  Diagnosis Date  . Mental disorder     History reviewed. No pertinent surgical history.   reports that he has never smoked. He has never used smokeless tobacco. He reports that he does not drink alcohol and does not use drugs.  No Known Allergies  Family History  Problem Relation Age of Onset  . Diabetes Father   . Heart failure Father     Unacceptable: Noncontributory, unremarkable, or negative. Acceptable: (example)Family history negative for heart disease  Prior to Admission medications   Medication Sig Start Date End Date Taking? Authorizing Provider  acetaminophen (TYLENOL) 500 MG tablet Take 500 mg by mouth every 6 (six) hours as needed for moderate pain.   Yes [provider]  augmented betamethasone dipropionate (DIPROLENE-AF) 0.05 % cream Apply 1 application topically 2 (two) times daily as needed (dry skin).  04/04/20  Yes [provider]  cefdinir (OMNICEF) 300 MG capsule Take 300 mg by mouth 2 (two) times daily.  05/05/20  Yes [provider]  fluticasone (FLONASE) 50 MCG/ACT nasal spray Place 1 spray into both nostrils daily.  04/12/20  Yes [provider]  ketoconazole (NIZORAL) 2 % cream Apply 1 application topically at bedtime.  05/07/20  Yes [provider]  ketoconazole (NIZORAL) 2 % shampoo Apply 1  application topically 3 (three) times a week. On MWF 05/07/20  Yes [provider]  metroNIDAZOLE (METROCREAM) 0.75 % cream Apply 1 application topically at bedtime.   Yes [provider]  mirtazapine (REMERON) 15 MG tablet Take 15 mg by mouth at bedtime. 05/01/20  Yes [provider]  MYRBETRIQ 50 MG TB24 tablet Take 50 mg by mouth daily. 05/01/20  Yes [provider]  OLANZapine (ZYPREXA) 2.5 MG tablet Take 2.5 mg by mouth at bedtime. 05/01/20  Yes [provider]  tamsulosin (FLOMAX) 0.4 MG CAPS capsule Take 0.4 mg by mouth daily. 05/01/20  Yes [provider]  TOVIAZ 4 MG TB24 tablet Take 4 mg by mouth daily. 05/01/20  Yes [provider]    Physical Exam: Vitals:   05/08/20 0345 05/08/20 0400 05/08/20 0430 05/08/20 0556  BP: 106/66 108/68 111/65 108/65  Pulse: 88 79 79 87  Resp: 15 16 14 16   Temp:    98 F (36.7 C)  TempSrc:    Oral  SpO2: 94% 93% 92% 93%  Weight:      Height:        Constitutional: NAD, calm, comfortable Vitals:   05/08/20 0345 05/08/20 0400 05/08/20 0430 05/08/20 0556  BP: 106/66 108/68 111/65 108/65  Pulse: 88 79 79 87  Resp: 15 16 14 16   Temp:    98 F (36.7 C)  TempSrc:    Oral  SpO2: 94% 93% 92% 93%  Weight:      Height:        General: [age] year old mentally retarded Caucasian male in no acute distress. Eyes: PERRL, lids and conjunctivae normal ENMT: Mucous membranes are moist. Posterior pharynx clear of any exudate or lesions.Normal dentition.  Neck: normal, supple, no masses, no thyromegaly Respiratory: clear to auscultation bilaterally, no wheezing, no crackles. Normal respiratory effort. No accessory muscle use.  Cardiovascular: Regular rate and rhythm, no murmurs / rubs / gallops. No extremity edema. 2+ pedal pulses. No carotid bruits.  Abdomen: Mild tenderness in suprapubic region, no masses palpated. No hepatosplenomegaly. Bowel sounds positive.  Musculoskeletal: no clubbing /  cyanosis. No joint deformity upper and lower extremities. Good ROM, no contractures. Normal muscle tone.  Skin: no rashes, lesions, ulcers. No induration Neurologic: Patient is awake, alert and oriented to place and person but not to time.  CN 2-12 grossly intact. Sensation intact, DTR normal. Strength 5/5 in all 4.     (Anything < 9 systems with 2 bullets each down codes to level 1) (If patient refuses exam can't bill higher level) (Make sure to document decubitus ulcers present on admission -- if possible -- and whether patient has chronic indwelling catheter at time of admission)  Labs on Admission: I have personally reviewed following labs and imaging studies  CBC: Recent Labs  Lab 05/07/20 1841  WBC 18.3*  NEUTROABS 14.6*  HGB 15.1  HCT 46.9  MCV 95.9  PLT 145*   Basic Metabolic Panel: Recent Labs  Lab 05/07/20 1841  NA 133*  K 4.1  CL 97*  CO2 20*  GLUCOSE 134*  BUN 83*  CREATININE 2.82*  CALCIUM 8.8*   GFR: Estimated Creatinine Clearance: 26.8 mL/min (A) (by C-G formula based on SCr of  2.82 mg/dL (H)). Liver Function Tests: Recent Labs  Lab 05/07/20 1841  AST 42*  ALT 52*  ALKPHOS 111  BILITOT 0.9  PROT 6.7  ALBUMIN 3.1*   No results for input(s): LIPASE, AMYLASE in the last 168 hours. No results for input(s): AMMONIA in the last 168 hours. Coagulation Profile: Recent Labs  Lab 05/07/20 2003  INR 1.2   Cardiac Enzymes: No results for input(s): CKTOTAL, CKMB, CKMBINDEX, TROPONINI in the last 168 hours. BNP (last 3 results) No results for input(s): PROBNP in the last 8760 hours. HbA1C: No results for input(s): HGBA1C in the last 72 hours. CBG: No results for input(s): GLUCAP in the last 168 hours. Lipid Profile: No results for input(s): CHOL, HDL, LDLCALC, TRIG, CHOLHDL, LDLDIRECT in the last 72 hours. Thyroid Function Tests: No results for input(s): TSH, T4TOTAL, FREET4, T3FREE, THYROIDAB in the last 72 hours. Anemia Panel: No results for  input(s): VITAMINB12, FOLATE, FERRITIN, TIBC, IRON, RETICCTPCT in the last 72 hours. Urine analysis:    Component Value Date/Time   COLORURINE YELLOW 05/07/2020 1945   APPEARANCEUR HAZY (A) 05/07/2020 1945   LABSPEC 1.016 05/07/2020 1945   PHURINE 5.0 05/07/2020 1945   GLUCOSEU NEGATIVE 05/07/2020 1945   HGBUR MODERATE (A) 05/07/2020 1945   BILIRUBINUR NEGATIVE 05/07/2020 1945   KETONESUR NEGATIVE 05/07/2020 1945   PROTEINUR NEGATIVE 05/07/2020 1945   NITRITE NEGATIVE 05/07/2020 1945   LEUKOCYTESUR SMALL (A) 05/07/2020 1945    Radiological Exams on Admission: DG Chest Port 1 View  Result Date: 05/07/2020 CLINICAL DATA:  Elevated white blood cell count EXAM: PORTABLE CHEST 1 VIEW COMPARISON:  02/27/2020 FINDINGS: Single frontal view of the chest demonstrates an unremarkable cardiac silhouette. No airspace disease, effusion, or pneumothorax. The 8 mm left lower lobe pulmonary nodule seen on recent chest CT is not visible by radiography. No acute bony abnormalities. IMPRESSION: 1. No acute intrathoracic process. 2. Previously identified 8 mm left lower lobe pulmonary nodule not identified by radiograph. Please refer to previous CT report for recommendations regarding follow-up. Electronically Signed   By: Sharlet Salina M.D.   On: 05/07/2020 19:52   CT Renal Stone Study  Result Date: 05/07/2020 CLINICAL DATA:  Renal failure.  Acute urinary retention EXAM: CT ABDOMEN AND PELVIS WITHOUT CONTRAST TECHNIQUE: Multidetector CT imaging of the abdomen and pelvis was performed following the standard protocol without IV contrast. COMPARISON:  None. FINDINGS: Lower chest: Bibasilar atelectasis. Hepatobiliary: Scattered hypodensities in the liver difficult to characterize on this noncontrast study, but favor cysts. Gallbladder unremarkable. Pancreas: No focal abnormality or ductal dilatation. Spleen: No focal abnormality.  Normal size. Adrenals/Urinary Tract: Foley catheter present in the bladder which is  decompressed. No hydronephrosis. No renal or adrenal mass. Stomach/Bowel: Stomach, large and small bowel grossly unremarkable. Normal appendix. Vascular/Lymphatic: No evidence of aneurysm or adenopathy. Reproductive: Prostate enlargement with a transverse diameter of 6.6 cm. Central calcifications. Other: No free fluid or free air. Musculoskeletal: No acute bony abnormality. IMPRESSION: Decompression a bladder with Foley catheter in place. No hydronephrosis. Scattered hypodensities in the liver, favor cysts, but cannot be characterized on this noncontrast study. Bibasilar atelectasis. Prostate enlargement. Electronically Signed   By: Charlett Nose M.D.   On: 05/07/2020 20:40      Assessment/Plan Principal Problem:   Sepsis secondary to UTI [facility]) Patient received IV fluids according to sepsis protocol.  Continue gentle hydration with IV normal saline.  Continue IV ceftriaxone.  Tylenol as needed for mild pain and fever.  Blood and urine cultures ordered.  Active Problems:   Acute urinary retention Patient had a distended urinary bladder and 1700 cc of urine is removed with Foley catheter.  Continue to monitor  Anxiety/depression Continue home medications  (please populate well all problems here in Problem List. (For example, if patient is on BP meds at home and you resume or decide to hold them, it is a problem that needs to be her. Same for CAD, COPD, HLD and so on)     DVT prophylaxis: Heparin Code Status: Full code Family Communication: No family member present at the bedside Disposition Plan: Group home after resolution of symptoms Consults called: None Admission status: Inpatient   Thalia Party MD [facility] Pager [phone]   If 7PM-7AM, please contact night-coverage [email or web address] Password [facility]  05/08/2020, 6:27 AM

## 2020-05-08 NOTE — Plan of Care (Signed)
Goals initiated  

## 2020-05-08 NOTE — Progress Notes (Addendum)
Same day note  Patient seen and examined at bedside.  Patient was admitted to the hospital for abnormal labs.  At the time of my evaluation, patient states that he is okay.  Ok, not a good historian.  Has history of cognitive decline.  Denies pain, chills, nausea, vomiting.  Physical examination reveals male with mild cognitive decline.  Chest clear.  Abdomen nontender.  No costovertebral angle tenderness.  Foley catheter in place.  Laboratory data and imaging was reviewed  Assessment and Plan.  Septic shock secondary to UTI, present on admission. Patient had leukocytosis with acute kidney injury (endorgan damage), mildly tachycardic with fever on presentation.  Lactic acid was elevated at 4 (septic shock as per definition).  Patient received septic fluid bolus followed by IV fluid resuscitation.  Patient has improved lactate at this time.  Leukocytosis has slightly trended down as well.  Blood cultures negative in less than 12 hours.  Continue IV antibiotic with IV Rocephin.  Continue IV fluids for now.  Urine culture pending.  Acute urinary retention.  Required Foley catheter.  Continue for now.  On mirabegron and tamsulosin as outpatient.  We will continue with that.  Anxiety depression.  On Zyprexa.  History of mild cognitive decline.  Supportive care.  DVT prophylaxis with heparin subcu.  Patient is from group home.  Likely disposition group home on discharge.  I tried to reach the patient's sister Ms. Beth listed as contact on the home and cell phone number listed but was unable to reach her.  No Charge  Signed,  Tenny Craw, MD [facility]

## 2020-05-09 DIAGNOSIS — R6520 Severe sepsis without septic shock: Secondary | ICD-10-CM

## 2020-05-09 DIAGNOSIS — R338 Other retention of urine: Secondary | ICD-10-CM

## 2020-05-09 DIAGNOSIS — A419 Sepsis, unspecified organism: Principal | ICD-10-CM

## 2020-05-09 DIAGNOSIS — N401 Enlarged prostate with lower urinary tract symptoms: Secondary | ICD-10-CM

## 2020-05-09 DIAGNOSIS — N390 Urinary tract infection, site not specified: Secondary | ICD-10-CM

## 2020-05-09 DIAGNOSIS — N138 Other obstructive and reflux uropathy: Secondary | ICD-10-CM

## 2020-05-09 DIAGNOSIS — R7989 Other specified abnormal findings of blood chemistry: Secondary | ICD-10-CM

## 2020-05-09 DIAGNOSIS — N179 Acute kidney failure, unspecified: Secondary | ICD-10-CM

## 2020-05-09 DIAGNOSIS — R5381 Other malaise: Secondary | ICD-10-CM

## 2020-05-09 LAB — COMPREHENSIVE METABOLIC PANEL
ALT: 55 U/L — ABNORMAL HIGH (ref 0–44)
AST: 54 U/L — ABNORMAL HIGH (ref 15–41)
Albumin: 2.2 g/dL — ABNORMAL LOW (ref 3.5–5.0)
Alkaline Phosphatase: 81 U/L (ref 38–126)
Anion gap: 11 (ref 5–15)
BUN: 15 mg/dL (ref 8–23)
CO2: 24 mmol/L (ref 22–32)
Calcium: 7.3 mg/dL — ABNORMAL LOW (ref 8.9–10.3)
Chloride: 105 mmol/L (ref 98–111)
Creatinine, Ser: 0.85 mg/dL (ref 0.61–1.24)
GFR, Estimated: >60 mL/min (ref >60)
Glucose, Bld: 105 mg/dL — ABNORMAL HIGH (ref 70–99)
Potassium: 3.9 mmol/L (ref 3.5–5.1)
Sodium: 140 mmol/L (ref 135–145)
Total Bilirubin: 0.8 mg/dL (ref 0.3–1.2)
Total Protein: 5.1 g/dL — ABNORMAL LOW (ref 6.5–8.1)

## 2020-05-09 LAB — MAGNESIUM: Magnesium: 2.2 mg/dL (ref 1.7–2.4)

## 2020-05-09 LAB — CBC
HCT: 39.3 % (ref 39.0–52.0)
Hemoglobin: 12.8 g/dL — ABNORMAL LOW (ref 13.0–17.0)
MCH: 31 pg (ref 26.0–34.0)
MCHC: 32.6 g/dL (ref 30.0–36.0)
MCV: 95.2 fL (ref 80.0–100.0)
Platelets: 123 10*3/uL — ABNORMAL LOW (ref 150–400)
RBC: 4.13 MIL/uL — ABNORMAL LOW (ref 4.22–5.81)
RDW: 13.7 % (ref 11.5–15.5)
WBC: 13.4 10*3/uL — ABNORMAL HIGH (ref 4.0–10.5)
nRBC: 0 % (ref 0.0–0.2)

## 2020-05-09 LAB — PHOSPHORUS: Phosphorus: 1.4 mg/dL — ABNORMAL LOW (ref 2.5–4.6)

## 2020-05-09 LAB — LACTIC ACID, PLASMA: Lactic Acid, Venous: 0.9 mmol/L (ref 0.5–1.9)

## 2020-05-09 MED ORDER — POLYETHYLENE GLYCOL 3350 17 G PO PACK
17.0000 g | PACK | Freq: Two times a day (BID) | ORAL | Status: DC | PRN
  Administered 2020-05-09 – 2020-05-10 (×2): 17 g via ORAL
  Filled 2020-05-09 (×2): qty 1

## 2020-05-09 MED ORDER — BOOST / RESOURCE BREEZE PO LIQD CUSTOM
1.0000 | Freq: Two times a day (BID) | ORAL | Status: DC
  Administered 2020-05-10 – 2020-05-12 (×5): 1 via ORAL

## 2020-05-09 MED ORDER — POTASSIUM PHOSPHATES 15 MMOLE/5ML IV SOLN
30.0000 mmol | Freq: Once | INTRAVENOUS | Status: AC
  Administered 2020-05-09: 30 mmol via INTRAVENOUS
  Filled 2020-05-09: qty 10

## 2020-05-09 MED ORDER — ADULT MULTIVITAMIN W/MINERALS CH
1.0000 | ORAL_TABLET | Freq: Every day | ORAL | Status: DC
  Administered 2020-05-09 – 2020-05-12 (×4): 1 via ORAL
  Filled 2020-05-09 (×4): qty 1

## 2020-05-09 MED ORDER — SENNOSIDES-DOCUSATE SODIUM 8.6-50 MG PO TABS
1.0000 | ORAL_TABLET | Freq: Two times a day (BID) | ORAL | Status: DC | PRN
  Administered 2020-05-09 – 2020-05-10 (×2): 1 via ORAL
  Filled 2020-05-09 (×2): qty 1

## 2020-05-09 NOTE — NC FL2 (Signed)
[place] MEDICAID FL2 LEVEL OF CARE SCREENING TOOL     IDENTIFICATION  Patient Name: Paul Moreno Birthdate: Jan 01, 1956 Sex: male Admission Date (Current Location): 05/07/2020  [facility] and [place] Number:  Producer, television/film/video and Address:  [facility],  [address] [place], [place] [address]      Provider Number: [id number]  Attending Physician Name and Address:  Almon Hercules, MD  Relative Name and Phone Number:  Cecelia Byars [phone]  [phone]    Current Level of Care: Hospital Recommended Level of Care: Skilled Nursing Facility Prior Approval Number:    Date Approved/Denied:   PASRR Number: Pending  Discharge Plan: SNF    Current Diagnoses: Patient Active Problem List   Diagnosis Date Noted  . Sepsis secondary to UTI (HCC) 05/08/2020  . Acute urinary retention 05/08/2020    Orientation RESPIRATION BLADDER Height & Weight     Self, Time, Situation, Place  Normal Incontinent, Indwelling catheter Weight: 160 lb (72.6 kg) Height:  5\' 10"  (177.8 cm)  BEHAVIORAL SYMPTOMS/MOOD NEUROLOGICAL BOWEL NUTRITION STATUS      Continent Diet  AMBULATORY STATUS COMMUNICATION OF NEEDS Skin   Limited Assist Verbally Normal                       Personal Care Assistance Level of Assistance  Bathing, Feeding, Dressing Bathing Assistance: Limited assistance Feeding assistance: Limited assistance Dressing Assistance: Limited assistance     Functional Limitations Info  Sight, Speech, Hearing Sight Info: Adequate Hearing Info: Adequate Speech Info: Adequate    SPECIAL CARE FACTORS FREQUENCY  PT (By licensed PT), OT (By licensed OT)     PT Frequency: Minimum 5x a week OT Frequency: Minimum 5x a week            Contractures Contractures Info: Not present    Additional Factors Info  Code Status, Allergies, Psychotropic Code Status Info: Full Code Allergies Info: NKA Psychotropic Info: mirtazapine (REMERON) tablet 15 mg and OLANZapine  (ZYPREXA) tablet 2.5 mg         Current Medications (05/09/2020):  This is the current hospital active medication list Current Facility-Administered Medications  Medication Dose Route Frequency Provider Last Rate Last Admin  . 0.9 %  sodium chloride infusion   Intravenous Continuous 13/11/2019 Z, DO 75 mL/hr at 05/09/20 0600 Rate Verify at 05/09/20 0600  . acetaminophen (TYLENOL) tablet 650 mg  650 mg Oral Q6H PRN 13/05/21 Z, DO       Or  . acetaminophen (TYLENOL) suppository 650 mg  650 mg Rectal Q6H PRN Renda Rolls Z, DO      . cefTRIAXone (ROCEPHIN) 1 g in sodium chloride 0.9 % 100 mL IVPB  1 g Intravenous Q24H Renda Rolls, PA-C   Stopped at 05/08/20 2203  . Chlorhexidine Gluconate Cloth 2 % PADS 6 each  6 each Topical Daily Pokhrel, Laxman, MD   6 each at 05/09/20 1037  . fluticasone (FLONASE) 50 MCG/ACT nasal spray 1 spray  1 spray Each Nare Daily 13/05/21 Z, DO   1 spray at 05/09/20 1037  . heparin injection 5,000 Units  5,000 Units Subcutaneous Q8H 13/05/21 Z, DO   5,000 Units at 05/09/20 1435  . mirtazapine (REMERON) tablet 15 mg  15 mg Oral QHS 13/05/21 Z, DO   15 mg at 05/08/20 2133  . multivitamin with minerals tablet 1 tablet  1 tablet Oral Daily 2134, MD   1 tablet at 05/09/20 1435  .  OLANZapine (ZYPREXA) tablet 2.5 mg  2.5 mg Oral QHS Renda Rolls Z, DO   2.5 mg at 05/08/20 2133  . ondansetron (ZOFRAN) tablet 4 mg  4 mg Oral Q6H PRN Renda Rolls Z, DO       Or  . ondansetron [facility]) injection 4 mg  4 mg Intravenous Q6H PRN Renda Rolls Z, DO      . polyethylene glycol (MIRALAX / GLYCOLAX) packet 17 g  17 g Oral BID PRN Candelaria Stagers T, MD   17 g at 05/09/20 1435  . potassium PHOSPHATE 30 mmol in dextrose 5 % 500 mL infusion  30 mmol Intravenous Once Candelaria Stagers T, MD 85 mL/hr at 05/09/20 1442 30 mmol at 05/09/20 1442  . senna-docusate (Senokot-S) tablet 1 tablet  1 tablet Oral BID PRN Almon Hercules, MD   1 tablet at 05/09/20 1435   . tamsulosin (FLOMAX) capsule 0.4 mg  0.4 mg Oral Daily Renda Rolls Z, DO   0.4 mg at 05/09/20 1037     Discharge Medications: Please see discharge summary for a list of discharge medications.  Relevant Imaging Results:  Relevant Lab Results:   Additional Information SSN [id number]  Darleene Cleaver, LCSW

## 2020-05-09 NOTE — Progress Notes (Signed)
Initial Nutrition Assessment  INTERVENTION:   -Boost Breeze po BID, each supplement provides 250 kcal and 9 grams of protein -Magic cup BID with meals, each supplement provides 290 kcal and 9 grams of protein  NUTRITION DIAGNOSIS:   Inadequate oral intake related to poor appetite as evidenced by per patient/family report.  GOAL:   Patient will meet greater than or equal to 90% of their needs  MONITOR:   PO intake, Supplement acceptance, Labs, Weight trends, I & O's  REASON FOR ASSESSMENT:   Consult Assessment of nutrition requirement/status  ASSESSMENT:   64 year old male with history of intellectual disability, urinary incontinence, anxiety and depression presented to ED for evaluation of abnormal labs including elevated troponin and leukocytosis.  Had generalized weakness, poor appetite and mild suprapubic tenderness. Admitted for severe sepsis due to UTI and acute urinary retention.  Patient alert/oriented but unable to provide history given cognitive decline per MD note.  Pt from group home.  Per sister, pt has not been eating well PTA.  Currently pt is consuming 100% of meals. Will order Boost Breeze and Magic cup supplements with meals.   No weight history available in weight records.   Medications: Remeron, Multivitamin with minerals daily, K-Phos, Miralax, Senokot  Labs reviewed: Low Phos Mg WNL  NUTRITION - FOCUSED PHYSICAL EXAM:  Deferred.  Diet Order:   Diet Order            Diet regular Room service appropriate? Yes; Fluid consistency: Thin  Diet effective now                 EDUCATION NEEDS:   No education needs have been identified at this time  Skin:  Skin Assessment: Reviewed RN Assessment  Last BM:  PTA  Height:   Ht Readings from Last 1 Encounters:  05/07/20 5\' 10"  (1.778 m)    Weight:   Wt Readings from Last 1 Encounters:  05/07/20 72.6 kg   BMI:  Body mass index is 22.96 kg/m.  Estimated Nutritional Needs:   Kcal:   1800-2000  Protein:  80-90g  Fluid:  2L/day  13/03/21, MS, RD, LDN Inpatient Clinical Dietitian Contact information available via Amion

## 2020-05-09 NOTE — Progress Notes (Signed)
PROGRESS NOTE  Paul Moreno IOX:[id number] DOB: 03/23/1956   PCP: Crist Infante, MD  Patient is from: Group home  DOA: 05/07/2020 LOS: 1  Chief complaints: Abnormal labs   Brief Narrative / Interim history: 64 year old male with history of intellectual disability, urinary incontinence, anxiety and depression presented to ED for evaluation of abnormal labs including elevated troponin and leukocytosis.  Had generalized weakness, poor appetite and mild suprapubic tenderness.  In ED, febrile to 100.5.  WBC 18.3 with left shift.  Platelet 145. Cr 2.82.  BUN 83.  Bicarb 20.  AG 16.  Na 133.  AST 42.  ALT 2. LA 4.0.  UA concerning for UTI.  RVP negative.  Bladder scan> 1000.  He had 1.7 L UOP after Foley insertion.  CT scan showed BPH but no hydronephrosis or other acute finding.  Cultures obtained.  Received IV fluid.  Started on IV ceftriaxone.  Admitted for severe sepsis due to UTI and acute urinary retention.  Subjective: Seen and examined earlier this morning.  No major events overnight of this morning.  No specific complaints.  He denies chest pain, dyspnea or GI symptoms.  Objective: Vitals:   05/08/20 1812 05/08/20 1931 05/09/20 0415 05/09/20 1232  BP: 107/63 107/66 114/67 119/68  Pulse: 91 (!) 101 90 89  Resp: $Remo'16  20 16  'MGvXf$ Temp: 98.3 F (36.8 C) 100 F (37.8 C) 97.8 F (36.6 C) 98.5 F (36.9 C)  TempSrc: Oral Oral Oral Oral  SpO2: 96% 99% 93% 99%  Weight:      Height:        Intake/Output Summary (Last 24 hours) at 05/09/2020 1237 Last data filed at 05/09/2020 0923 Gross per 24 hour  Intake 2574.58 ml  Output 2100 ml  Net 474.58 ml   Filed Weights   05/07/20 2012  Weight: 72.6 kg    Examination:  GENERAL: No apparent distress.  Nontoxic. HEENT: MMM.  Vision and hearing grossly intact.  NECK: Supple.  No apparent JVD.  RESP: On RA.  No IWOB.  Fair aeration bilaterally. CVS:  RRR. Heart sounds normal.  ABD/GI/GU: BS+. Abd soft, NTND.  MSK/EXT:  Moves extremities. No  apparent deformity. No edema.  SKIN: no apparent skin lesion or wound NEURO: Awake, alert and oriented appropriately.  No apparent focal neuro deficit. PSYCH: Calm. Normal affect.  Procedures:  None  Microbiology summarized: RVP negative. Blood cultures NGTD. Urine culture pending.  Assessment & Plan: Severe sepsis secondary to UTI: POA.  Met criteria for severe sepsis with fever, leukocytosis, UTI and evidence of endorgan damage including lactic acidosis and AKI.  Sepsis physiology resolving.  Blood cultures NGTD.  Of note, patient was started on cefdinir on 05/05/2020 outpatient. -Continue IV ceftriaxone -Follow urine culture  Acute urinary retention: Had about 1.7 L UOP after Foley insertion in ED.  Likely due to BPH.  CT renal study without hydronephrosis but BPH. -Hold Myrbetriq -Need Foley at least for 1 to 2 weeks for bladder decompression -Continue Flomax -Check PSA  AKI/azotemia: Cr 2.82 (admit)>>> 0.85.  Azotemia resolved.  Likely a combination of poor p.o. intake, ATN due to sepsis and possible obstructive uropathy although he does not have hydronephrosis on CT.  AKI resolved. -Avoid nephrotoxic meds -Continue Foley catheter as above -Recheck in the morning  Elevated liver enzymes: Likely due to sepsis -Recheck in the morning.  -Further work-up if no improvement  Anxiety/depression: Stable -Continue home medications-olanzapine and Remeron  Hypophosphatemia: -Replenish and recheck.  Generalized weakness -PT/OT eval  Poor appetite  Body mass index is 22.96 kg/m.  -Consult dietitian -Continue Remeron. -Add multivitamin -Change diet to regular.       DVT prophylaxis:  heparin injection 5,000 Units Start: 05/08/20 0600  Code Status: Full code Family Communication: Patient and/or RN.  Attempted to update patient's sister but no answer. Status is: Inpatient  Remains inpatient appropriate because:Persistent severe electrolyte disturbances, IV treatments  appropriate due to intensity of illness or inability to take PO and Inpatient level of care appropriate due to severity of illness   Dispo: The patient is from: Group home              Anticipated d/c is to: To be determined              Anticipated d/c date is: 1 day              Patient currently is not medically stable to d/c.       Consultants:  None   Sch Meds:  Scheduled Meds: . Chlorhexidine Gluconate Cloth  6 each Topical Daily  . fluticasone  1 spray Each Nare Daily  . heparin  5,000 Units Subcutaneous Q8H  . mirtazapine  15 mg Oral QHS  . multivitamin with minerals  1 tablet Oral Daily  . OLANZapine  2.5 mg Oral QHS  . tamsulosin  0.4 mg Oral Daily   Continuous Infusions: . sodium chloride 75 mL/hr at 05/09/20 0600  . cefTRIAXone (ROCEPHIN)  IV Stopped (05/08/20 2203)  . potassium PHOSPHATE IVPB (in mmol)     PRN Meds:.acetaminophen **OR** acetaminophen, ondansetron **OR** ondansetron (ZOFRAN) IV  Antimicrobials: Anti-infectives (From admission, onward)   Start     Dose/Rate Route Frequency Ordered Stop   05/08/20 0145  cefTRIAXone (ROCEPHIN) 1 g in sodium chloride 0.9 % 100 mL IVPB  Status:  Discontinued       Note to Pharmacy: pharamacy to adjust timing for next dose.   1 g 200 mL/hr over 30 Minutes Intravenous Every 24 hours 05/08/20 0142 05/08/20 0145   05/07/20 2015  cefTRIAXone (ROCEPHIN) 1 g in sodium chloride 0.9 % 100 mL IVPB        1 g 200 mL/hr over 30 Minutes Intravenous Every 24 hours 05/07/20 2010         I have personally reviewed the following labs and images: CBC: Recent Labs  Lab 05/07/20 1841 05/08/20 0605 05/09/20 0420  WBC 18.3* 11.1* 13.4*  NEUTROABS 14.6*  --   --   HGB 15.1 13.8 12.8*  HCT 46.9 40.5 39.3  MCV 95.9 93.1 95.2  PLT 145* 96* 123*   BMP &GFR Recent Labs  Lab 05/07/20 1841 05/08/20 0605 05/09/20 0420  NA 133* 139 140  K 4.1 3.7 3.9  CL 97* 109 105  CO2 20* 22 24  GLUCOSE 134* 89 105*  BUN 83* 41* 15    CREATININE 2.82* 1.13 0.85  CALCIUM 8.8* 7.7* 7.3*  MG  --   --  2.2  PHOS  --   --  1.4*   Estimated Creatinine Clearance: 89 mL/min (by C-G formula based on SCr of 0.85 mg/dL). Liver & Pancreas: Recent Labs  Lab 05/07/20 1841 05/09/20 0420  AST 42* 54*  ALT 52* 55*  ALKPHOS 111 81  BILITOT 0.9 0.8  PROT 6.7 5.1*  ALBUMIN 3.1* 2.2*   No results for input(s): LIPASE, AMYLASE in the last 168 hours. No results for input(s): AMMONIA in the last 168 hours. Diabetic: No results for input(s): HGBA1C in  the last 72 hours. No results for input(s): GLUCAP in the last 168 hours. Cardiac Enzymes: No results for input(s): CKTOTAL, CKMB, CKMBINDEX, TROPONINI in the last 168 hours. No results for input(s): PROBNP in the last 8760 hours. Coagulation Profile: Recent Labs  Lab 05/07/20 2003  INR 1.2   Thyroid Function Tests: No results for input(s): TSH, T4TOTAL, FREET4, T3FREE, THYROIDAB in the last 72 hours. Lipid Profile: No results for input(s): CHOL, HDL, LDLCALC, TRIG, CHOLHDL, LDLDIRECT in the last 72 hours. Anemia Panel: No results for input(s): VITAMINB12, FOLATE, FERRITIN, TIBC, IRON, RETICCTPCT in the last 72 hours. Urine analysis:    Component Value Date/Time   COLORURINE YELLOW 05/07/2020 1945   APPEARANCEUR HAZY (A) 05/07/2020 1945   LABSPEC 1.016 05/07/2020 1945   PHURINE 5.0 05/07/2020 1945   GLUCOSEU NEGATIVE 05/07/2020 1945   HGBUR MODERATE (A) 05/07/2020 1945   BILIRUBINUR NEGATIVE 05/07/2020 1945   KETONESUR NEGATIVE 05/07/2020 1945   PROTEINUR NEGATIVE 05/07/2020 1945   NITRITE NEGATIVE 05/07/2020 1945   LEUKOCYTESUR SMALL (A) 05/07/2020 1945   Sepsis Labs: Invalid input(s): PROCALCITONIN, [place]  Microbiology: Recent Results (from the past 240 hour(s))  Blood Cultures (routine x 2)     Status: None (Preliminary result)   Collection Time: 05/07/20  7:45 PM   Specimen: BLOOD RIGHT FOREARM  Result Value Ref Range Status   Specimen Description    Final    BLOOD RIGHT FOREARM Performed at [facility] [address], [place], [place] [address]    Special Requests   Final    BOTTLES DRAWN AEROBIC AND ANAEROBIC Blood Culture results may not be optimal due to an inadequate volume of blood received in culture bottles Performed at [facility] [address], [place], [place] [address]    Culture   Final    NO GROWTH 2 DAYS Performed at [facility] [address], [place],  [address]    Report Status PENDING  Incomplete  Resp Panel by RT PCR (RSV, Flu A&B, Covid) - Nasopharyngeal Swab     Status: None   Collection Time: 05/07/20  7:45 PM   Specimen: Nasopharyngeal Swab  Result Value Ref Range Status   SARS Coronavirus 2 by RT PCR NEGATIVE NEGATIVE Final    Comment: (NOTE) SARS-CoV-2 target nucleic acids are NOT DETECTED.  The SARS-CoV-2 RNA is generally detectable in upper respiratoy specimens during the acute phase of infection. The lowest concentration of SARS-CoV-2 viral copies this assay can detect is 131 copies/mL. A negative result does not preclude SARS-Cov-2 infection and should not be used as the sole basis for treatment or other patient management decisions. A negative result may occur with  improper specimen collection/handling, submission of specimen other than nasopharyngeal swab, presence of viral mutation(s) within the areas targeted by this assay, and inadequate number of viral copies (<131 copies/mL). A negative result must be combined with clinical observations, patient history, and epidemiological information. The expected result is Negative.  Fact Sheet for Patients:  PinkCheek.be  Fact Sheet for Healthcare Providers:  GravelBags.it  This test is no t yet approved or cleared by the [place] FDA and  has been authorized for detection and/or diagnosis of SARS-CoV-2 by FDA under an Emergency  Use Authorization (EUA). This EUA will remain  in effect (meaning this test can be used) for the duration of the COVID-19 declaration under Section 564(b)(1) of the Act, 21 U.S.C. section 360bbb-3(b)(1), unless the authorization is terminated or revoked sooner.  Influenza A by PCR NEGATIVE NEGATIVE Final   Influenza B by PCR NEGATIVE NEGATIVE Final    Comment: (NOTE) The Xpert Xpress SARS-CoV-2/FLU/RSV assay is intended as an aid in  the diagnosis of influenza from Nasopharyngeal swab specimens and  should not be used as a sole basis for treatment. Nasal washings and  aspirates are unacceptable for Xpert Xpress SARS-CoV-2/FLU/RSV  testing.  Fact Sheet for Patients: PinkCheek.be  Fact Sheet for Healthcare Providers: GravelBags.it  This test is not yet approved or cleared by the [place] FDA and  has been authorized for detection and/or diagnosis of SARS-CoV-2 by  FDA under an Emergency Use Authorization (EUA). This EUA will remain  in effect (meaning this test can be used) for the duration of the  Covid-19 declaration under Section 564(b)(1) of the Act, 21  U.S.C. section 360bbb-3(b)(1), unless the authorization is  terminated or revoked.    Respiratory Syncytial Virus by PCR NEGATIVE NEGATIVE Final    Comment: (NOTE) Fact Sheet for Patients: PinkCheek.be  Fact Sheet for Healthcare Providers: GravelBags.it  This test is not yet approved or cleared by the [place] FDA and  has been authorized for detection and/or diagnosis of SARS-CoV-2 by  FDA under an Emergency Use Authorization (EUA). This EUA will remain  in effect (meaning this test can be used) for the duration of the  COVID-19 declaration under Section 564(b)(1) of the Act, 21 U.S.C.  section 360bbb-3(b)(1), unless the authorization is terminated or  revoked. Performed at [facility], [place] [address] [place], [place] [address]   Blood Cultures (routine x 2)     Status: None (Preliminary result)   Collection Time: 05/07/20  8:01 PM   Specimen: BLOOD  Result Value Ref Range Status   Specimen Description   Final    BLOOD LEFT ANTECUBITAL Performed at [facility] [address], [place], [place] [address]    Special Requests   Final    BOTTLES DRAWN AEROBIC AND ANAEROBIC Blood Culture results may not be optimal due to an excessive volume of blood received in culture bottles Performed at [facility] [address], [place], [place] [address]    Culture   Final    NO GROWTH 2 DAYS Performed at [facility] [address] [place], [place] [address]    Report Status PENDING  Incomplete    Radiology Studies: No results found.    Devita Nies T. [place]  If 7PM-7AM, please contact night-coverage [email or web address] 05/09/2020, 12:37 PM

## 2020-05-09 NOTE — Clinical Social Work Note (Signed)
30 Day Passar Note  RE: Paul Moreno        Date of Birth: _14-Aug-1957_   Date: 05/09/2020       To Whom It May Concern:  Please be advised that the above-named patient will require a short-term nursing home stay - anticipated 30 days or less for rehabilitation and strengthening.  The plan is for return home.   Windell Moulding, MSW, [place] [phone]

## 2020-05-09 NOTE — TOC Initial Note (Signed)
Transition of Care [facility]) - Initial/Assessment Note    Patient Details  Name: Paul Moreno MRN: [id number] Date of Birth: 1956/01/30  Transition of Care [facility]) CM/SW Contact:    Darleene Cleaver, LCSW Phone Number: 05/09/2020, 4:05 PM  Clinical Narrative:                  Patient is a 64 year old male from [facility] group home.  Patient is alert and oriented x4.  CSW spoke to patient's sister Waynetta Sandy [phone] and also spoke to group home manager Arlys John [phone] regarding discharge planning for patient.  CSW was informed that patient may need to discharge on a foley catheter, CSW asked if the group home would be able to manage it.  CSW was informed by Arlys John, to contact his director Murlean Caller, [phone].  CSW attempted to contact Christiane Ha and left a message on voice mail.  CSW discussed with sister if she would be agreeable to SNF if patient is unable to return to the group home.  She said yes and gave CSW permission to begin bed search in [facility].  CSW explained how insurance will pay for stay and what to expect at SNF.  Patient's sister explained that she would like him to go to a "good," SNF.  CSW informed her that there are 18 facilities, and no opinion can be given for him.    Expected Discharge Plan: Group Home Barriers to Discharge: Continued Medical Work up, English as a second language teacher   Patient Goals and CMS Choice Patient states their goals for this hospitalization and ongoing recovery are:: To return back to group home. CMS Medicare.gov Compare Post Acute Care list provided to:: Other (Comment Required) [facility]ales Group Home Manager) Choice offered to / list presented to : Sibling, [facility] POA / Guardian  Expected Discharge Plan and Services Expected Discharge Plan: Group Home                           DME Agency: NA       HH Arranged: NA          Prior Living Arrangements/Services   Lives with:: Other (Comment) ([facility]) Patient language and need for  interpreter reviewed:: Yes Do you feel safe going back to the place where you live?: No   Patient may not be able to return back to group home with a Foley Catheter.  Need for Family Participation in Patient Care: Yes (Comment) Care giver support system in place?: Yes (comment)   Criminal Activity/Legal Involvement Pertinent to Current Situation/Hospitalization: No - Comment as needed  Activities of Daily Living Home Assistive Devices/Equipment: None ADL Screening (condition at time of admission) Patient's cognitive ability adequate to safely complete daily activities?: No Is the patient deaf or have difficulty hearing?: No Does the patient have difficulty seeing, even when wearing glasses/contacts?: No Does the patient have difficulty concentrating, remembering, or making decisions?: Yes Patient able to express need for assistance with ADLs?: Yes Does the patient have difficulty dressing or bathing?: No Independently performs ADLs?: Yes (appropriate for developmental age) (having some weakness) Does the patient have difficulty walking or climbing stairs?: No Weakness of Legs: Both Weakness of Arms/Hands: None  Permission Sought/Granted Permission sought to share information with : Facility Medical sales representative, Family Supports, Guardian Permission granted to share information with : Yes, Release of Information Signed  Share Information with NAME: Clelia Schaumann Sister [phone]  [phone]  Permission granted to share info w AGENCY:  SNF admissions        Emotional Assessment Appearance:: Appears stated age Attitude/Demeanor/Rapport: Engaged Affect (typically observed): Accepting, Appropriate, Calm, Stable Orientation: : Oriented to Self, Oriented to Place, Oriented to  Time, Oriented to Situation Alcohol / Substance Use: Not Applicable Psych Involvement: No (comment)  Admission diagnosis:  Obstructive uropathy [N13.9] Acute urinary retention [R33.8] Sepsis secondary to UTI  (HCC) [A41.9, N39.0] Sepsis due to urinary tract infection (HCC) [A41.9, N39.0] Patient Active Problem List   Diagnosis Date Noted  . Sepsis secondary to UTI (HCC) 05/08/2020  . Acute urinary retention 05/08/2020   PCP:  [name], MD Pharmacy:   [facility] - [place], [place] - [phone] [address] [place] [address] Phone: [phone] Fax: [phone]     Social Determinants of Health (SDOH) Interventions    Readmission Risk Interventions No flowsheet data found.

## 2020-05-10 DIAGNOSIS — R748 Abnormal levels of other serum enzymes: Secondary | ICD-10-CM

## 2020-05-10 LAB — CBC
HCT: 40.3 % (ref 39.0–52.0)
Hemoglobin: 13 g/dL (ref 13.0–17.0)
MCH: 31.5 pg (ref 26.0–34.0)
MCHC: 32.3 g/dL (ref 30.0–36.0)
MCV: 97.6 fL (ref 80.0–100.0)
Platelets: 155 10*3/uL (ref 150–400)
RBC: 4.13 MIL/uL — ABNORMAL LOW (ref 4.22–5.81)
RDW: 13.9 % (ref 11.5–15.5)
WBC: 17.1 10*3/uL — ABNORMAL HIGH (ref 4.0–10.5)
nRBC: 0 % (ref 0.0–0.2)

## 2020-05-10 LAB — HEPATITIS PANEL, ACUTE
HCV Ab: NONREACTIVE
Hep A IgM: NONREACTIVE
Hep B C IgM: NONREACTIVE
Hepatitis B Surface Ag: NONREACTIVE

## 2020-05-10 LAB — COMPREHENSIVE METABOLIC PANEL
ALT: 96 U/L — ABNORMAL HIGH (ref 0–44)
AST: 76 U/L — ABNORMAL HIGH (ref 15–41)
Albumin: 2.1 g/dL — ABNORMAL LOW (ref 3.5–5.0)
Alkaline Phosphatase: 102 U/L (ref 38–126)
Anion gap: 9 (ref 5–15)
BUN: 17 mg/dL (ref 8–23)
CO2: 26 mmol/L (ref 22–32)
Calcium: 7.4 mg/dL — ABNORMAL LOW (ref 8.9–10.3)
Chloride: 105 mmol/L (ref 98–111)
Creatinine, Ser: 0.85 mg/dL (ref 0.61–1.24)
GFR, Estimated: >60 mL/min (ref >60)
Glucose, Bld: 104 mg/dL — ABNORMAL HIGH (ref 70–99)
Potassium: 4 mmol/L (ref 3.5–5.1)
Sodium: 140 mmol/L (ref 135–145)
Total Bilirubin: 0.6 mg/dL (ref 0.3–1.2)
Total Protein: 5.1 g/dL — ABNORMAL LOW (ref 6.5–8.1)

## 2020-05-10 LAB — PSA (REFLEX TO FREE) (SERIAL): Prostate Specific Ag, Serum: 12 ng/mL — ABNORMAL HIGH (ref 0.0–4.0)

## 2020-05-10 LAB — PHOSPHORUS: Phosphorus: 1.9 mg/dL — ABNORMAL LOW (ref 2.5–4.6)

## 2020-05-10 LAB — MAGNESIUM: Magnesium: 2 mg/dL (ref 1.7–2.4)

## 2020-05-10 NOTE — Evaluation (Signed)
Occupational Therapy Evaluation Patient Details Name: Paul Moreno MRN: [id number] DOB: 1955/09/04 Today's Date: 05/10/2020    History of Present Illness 64 year old male with history of intellectual disability, urinary incontinence, anxiety and depression presented to ED for evaluation of abnormal labs including elevated troponin and leukocytosis. Patient admitted for sepsis secondary to UTI and currently requiring foley catheter.   Clinical Impression   Paul Moreno is a 64 year old man who presents with above pertinent medical history. On evaluation patient demonstrates normal ROM and strength of upper extremities, demonstrates ability to perform functional mobility with min guard/ supervision while pushing IV pole, and ability to perform ADLs with supervision and verbal cues as patient not in a familiar environment and/or using typical belongings. Patient pleasant and reports "feeling great when I walk." Patient appears to be at his baseline in regards to functional abilities except for the need for foley catheter. No OT needs at this time. Patient will need 24/7 supervision/assistance at discharge as he would not be able to manage catheter himself due to intellectual disability.    Follow Up Recommendations  No OT follow up    Equipment Recommendations  None recommended by OT    Recommendations for Other Services       Precautions / Restrictions Precautions Precautions: None      Mobility Bed Mobility Overal bed mobility: Independent             General bed mobility comments: no physical assistance needed for bed mobility.    Transfers Overall transfer level: Needs assistance Equipment used: None             General transfer comment: Patient ambulated in room and in hall pushing IV pole. No loss of balance or difficulty with ambulation. Patient exhibited wide BOS with ambulation - potentially from catheter.    Balance Overall balance assessment: Mild deficits  observed, not formally tested                                         ADL either performed or assessed with clinical judgement   ADL Overall ADL's : Needs assistance/impaired Eating/Feeding: Independent   Grooming: Supervision/safety   Upper Body Bathing: Supervision/ safety   Lower Body Bathing: Supervison/ safety   Upper Body Dressing : Supervision/safety   Lower Body Dressing: Supervision/safety   Toilet Transfer: Supervision/safety   Toileting- Clothing Manipulation and Hygiene: Supervision/safety               Vision   Vision Assessment?: No apparent visual deficits     Perception     Praxis      Pertinent Vitals/Pain Pain Assessment: No/denies pain     Hand Dominance Right   Extremity/Trunk Assessment Upper Extremity Assessment Upper Extremity Assessment: Overall WFL for tasks assessed   Lower Extremity Assessment Lower Extremity Assessment: Defer to PT evaluation   Cervical / Trunk Assessment Cervical / Trunk Assessment: Normal   Communication Communication Communication: No difficulties   Cognition Arousal/Alertness: Awake/alert Behavior During Therapy: WFL for tasks assessed/performed Overall Cognitive Status: History of cognitive impairments - at baseline                                     General Comments  Patient reached out to steady himself x 1 after initial stand.    Exercises  Shoulder Instructions      Home Living Family/patient expects to be discharged to:: Group home                                        Prior Functioning/Environment Level of Independence: Independent        Comments: Reports he is independent with ADLs at group home. reports he likes to watch tv, talk to his dog, and he takes the garbage cans out at the group home.        OT Problem List:        OT Treatment/Interventions:      OT Goals(Current goals can be found in the care plan section)  Acute Rehab OT Goals Patient Stated Goal: states "I feel great walking" OT Goal Formulation: All assessment and education complete, DC therapy  OT Frequency:     Barriers to D/C:            Co-evaluation              AM-PAC OT "6 Clicks" Daily Activity     Outcome Measure Help from another person eating meals?: None Help from another person taking care of personal grooming?: A Little Help from another person toileting, which includes using toliet, bedpan, or urinal?: A Little Help from another person bathing (including washing, rinsing, drying)?: A Little Help from another person to put on and taking off regular upper body clothing?: A Little Help from another person to put on and taking off regular lower body clothing?: A Little 6 Click Score: 19   End of Session Equipment Utilized During Treatment: Gait belt Nurse Communication:  (okay to see per RN.)  Activity Tolerance: Patient tolerated treatment well Patient left: in chair;with call bell/phone within reach;with chair alarm set                   Time: [phone] OT Time Calculation (min): 19 min Charges:  OT General Charges $OT Visit: 1 Visit OT Evaluation $OT Eval Low Complexity: 1 Low  Aniyah Nobis, OTR/L Acute Care Rehab Services  Office [phone] Pager: [phone]   Kelli Churn 05/10/2020, 12:59 PM

## 2020-05-10 NOTE — Evaluation (Signed)
Physical Therapy Evaluation Patient Details Name: Paul Moreno MRN: [id number] DOB: 1956/03/16 Today's Date: 05/10/2020   History of Present Illness  64 year old male with history of intellectual disability, urinary incontinence, anxiety and depression presented to ED for evaluation of abnormal labs including elevated troponin and leukocytosis.  Clinical Impression  Pt very pleasant and agreeable to mobilize and move about during session. Sister was present and very helpful with PLOF and history. PT has been living in same group home for more than 20 years, has a great routine and relationship with staff. Pt challenged now with some mild weakness, decrease activity tolerance and balance from current medical condition as well as a foley that may prevent from DC back to group home. PT could benefit from short term PT either in ST-SNF setting or HHPT depending on pt's progress and ability of group home. Will continue to follow while here on acute care to assist with discharge plans.     Follow Up Recommendations SNF (depending on progress may be able to have HHPT at group home)    Equipment Recommendations  None recommended by PT    Recommendations for Other Services       Precautions / Restrictions Precautions Precautions: None      Mobility  Bed Mobility Overal bed mobility: Independent             General bed mobility comments: no physical assistance needed for bed mobility.    Transfers Overall transfer level: Needs assistance Equipment used: None Transfers: Sit to/from Stand           General transfer comment: trasnfers up and down off toilet , use of rail, no noted LOB with turning and moving in bathroom but assistance and cues due to lines and tubes he was unaware of how to manage . Had to do total assist for hygiene for BM from toileting.  Ambulation/Gait Ambulation/Gait assistance: Min guard Gait Distance (Feet): 50 Feet Assistive device: None Gait  Pattern/deviations: Step-through pattern     General Gait Details: PT with wide base of support, limited distance due to need to have a BM and then he stated he was fatigued after that and needed to lie down. Sister reports his endurance has declined over last few weeks, especially last few days, but he is doing a lot better today per sister.  Stairs            Wheelchair Mobility    Modified Rankin (Stroke Patients Only)       Balance Overall balance assessment: Mild deficits observed, not formally tested                                           Pertinent Vitals/Pain Pain Assessment: No/denies pain    Home Living Family/patient expects to be discharged to:: Group home                      Prior Function Level of Independence: Independent         Comments: Reports he is independent with ADLs at group home. reports he likes to watch tv, talk to his dog, and he takes the garbage cans out at the group home. Sister reports he has gotten weaker over lat few months with less activity and mobility. Pt typically likes routine and likes independence.     Hand Dominance   Dominant Hand: Right  Extremity/Trunk Assessment        Lower Extremity Assessment Lower Extremity Assessment: Generalized weakness    Cervical / Trunk Assessment Cervical / Trunk Assessment: Normal  Communication   Communication: No difficulties  Cognition Arousal/Alertness: Awake/alert Behavior During Therapy: WFL for tasks assessed/performed Overall Cognitive Status: History of cognitive impairments - at baseline                                 General Comments: He has been in this group home for 20 years now.      General Comments      Exercises     Assessment/Plan    PT Assessment Patient needs continued PT services  PT Problem List Decreased strength;Decreased activity tolerance;Decreased balance;Decreased mobility       PT Treatment  Interventions Gait training;Functional mobility training;Therapeutic activities;Balance training    PT Goals (Current goals can be found in the Care Plan section)  Acute Rehab PT Goals Patient Stated Goal: I like getting up and walking PT Goal Formulation: With patient Time For Goal Achievement: 05/24/20 Potential to Achieve Goals: Good    Frequency Min 3X/week   Barriers to discharge        Co-evaluation               AM-PAC PT "6 Clicks" Mobility  Outcome Measure Help needed turning from your back to your side while in a flat bed without using bedrails?: None Help needed moving from lying on your back to sitting on the side of a flat bed without using bedrails?: None Help needed moving to and from a bed to a chair (including a wheelchair)?: A Little Help needed standing up from a chair using your arms (e.g., wheelchair or bedside chair)?: A Little Help needed to walk in hospital room?: A Little Help needed climbing 3-5 steps with a railing? : A Lot 6 Click Score: 19    End of Session Equipment Utilized During Treatment: Gait belt Activity Tolerance: Patient limited by fatigue Patient left: in bed;with bed alarm set;with call bell/phone within reach;with family/visitor present Nurse Communication: Mobility status PT Visit Diagnosis: Other abnormalities of gait and mobility (R26.89)    Time: 1600-1630 PT Time Calculation (min) (ACUTE ONLY): 30 min   Charges:   PT Evaluation $PT Eval Low Complexity: 1 Low PT Treatments $Gait Training: 8-22 mins        Manal Kreutzer, PT, MPT Acute Rehabilitation Services Office: [phone] Pager: [phone] 05/10/2020   Marella Bile 05/10/2020, 5:32 PM

## 2020-05-10 NOTE — Progress Notes (Signed)
PROGRESS NOTE  Paul Moreno XNT:[id number] DOB: [birth year]   PCP: Crist Infante, MD  Patient is from: Group home  DOA: 05/07/2020 LOS: 2  Chief complaints: Abnormal labs   Brief Narrative / Interim history: 64 year old male with history of intellectual disability, urinary incontinence, anxiety and depression presented to ED for evaluation of abnormal labs including elevated troponin and leukocytosis.  Had generalized weakness, poor appetite and mild suprapubic tenderness.  In ED, febrile to 100.5.  WBC 18.3 with left shift.  Platelet 145. Cr 2.82.  BUN 83.  Bicarb 20.  AG 16.  Na 133.  AST 42.  ALT 2. LA 4.0.  UA concerning for UTI.  RVP negative.  Bladder scan> 1000.  He had 1.7 L UOP after Foley insertion.  CT scan showed BPH but no hydronephrosis or other acute finding.  Cultures obtained.  Received IV fluid.  Started on IV ceftriaxone.  Admitted for severe sepsis due to UTI and acute urinary retention with bladder over distention.  Blood cultures negative.  Urine culture ordered but was not sent.  Another urine culture ordered on 11/6.  Subjective: Seen and examined earlier this morning.  No major events overnight of this morning.  No complaints.  He denies chest pain, dyspnea, GI or UTI symptoms.  Clear looking urine in Foley bag.  Objective: Vitals:   05/09/20 0415 05/09/20 1232 05/09/20 2117 05/10/20 0515  BP: 114/67 119/68 122/71 123/75  Pulse: 90 89 95 82  Resp: _0 Temp: 97.8 F (36.6 C) 98.5 F (36.9 C) 98.6 F (37 C) 97.9 F (36.6 C)  TempSrc: Oral Oral Oral Oral  SpO2: 93% 99% 98% 100%  Weight:      Height:        Intake/Output Summary (Last 24 hours) at 05/10/2020 1040 Last data filed at 05/10/2020 0900 Gross per 24 hour  Intake 2697.75 ml  Output 3050 ml  Net -352.25 ml   Filed Weights   05/07/20 2012  Weight: 72.6 kg    Examination:  GENERAL: No apparent distress.  Nontoxic. HEENT: MMM.  Vision and hearing grossly intact.  NECK: Supple.  No  apparent JVD.  RESP: On room air.  No IWOB.  Fair aeration bilaterally. CVS:  RRR. Heart sounds normal.  ABD/GI/GU: BS+. Abd soft, NTND.  Indwelling Foley. MSK/EXT:  Moves extremities. No apparent deformity. No edema.  SKIN: no apparent skin lesion or wound NEURO: Awake, alert and oriented appropriately.  No apparent focal neuro deficit. PSYCH: Calm. Normal affect.  Procedures:  None  Microbiology summarized: 11/3-RVP negative. 11/3-blood cultures NGTD. 11/3-urine culture ordered but not sent. 11/6-urine culture pending.  Assessment & Plan: Severe sepsis secondary to UTI: POA.  Met criteria for severe sepsis with fever, leukocytosis, UTI and evidence of endorgan damage including lactic acidosis and AKI.  Blood cultures NGTD.  Urine culture ordered but was not sent on admission.  Sepsis physiology resolving except for leukocytosis. -Continue IV ceftriaxone -Will order another urine culture although this is going to be low yield  Acute urinary retention: Had about 1.7 L UOP after Foley insertion in ED.  Likely due to BPH.  CT renal study without hydronephrosis but BPH. -Hold Myrbetriq -Need Foley at least for 1 to 2 weeks for bladder decompression -Continue Flomax -Follow PSA results.  AKI/azotemia: Cr 2.82 (admit)>>> 0.85.  Azotemia resolved.  Likely a combination of poor p.o. intake, ATN due to sepsis and possible obstructive uropathy although he does not have hydronephrosis on CT.  AKI resolved. -  Avoid nephrotoxic meds -Continue Foley catheter as above -Monitor intermittently  Elevated liver enzymes: Due to sepsis?  LFT slightly uptrending.  Abdominal exam benign. -Check acute hepatitis panel and HIV -Will obtain RUQ [place] if no improvement.  Anxiety/depression: Stable -Continue home medications-olanzapine and Remeron  Hypophosphatemia: Replenished. -Recheck.  Generalized weakness -OOB/PT/OT eval  Poor appetite: Likely due to acute illness. -Appreciate input by  dietitian -Continue Remeron and multivitamin Body mass index is 22.96 kg/m. Nutrition Problem: Inadequate oral intake Etiology: poor appetite Signs/Symptoms: per patient/family report Interventions: Magic cup, Boost Breeze   DVT prophylaxis:  heparin injection 5,000 Units Start: 05/08/20 0600  Code Status: Full code Family Communication: Updated patient's sister over the phone. Status is: Inpatient  Remains inpatient appropriate because:Persistent severe electrolyte disturbances, IV treatments appropriate due to intensity of illness or inability to take PO and Inpatient level of care appropriate due to severity of illness   Dispo: The patient is from: Group home              Anticipated d/c is to: SNF              Anticipated d/c date is: 3 days              Patient currently is not medically stable to d/c.       Consultants:  None   Sch Meds:  Scheduled Meds: . Chlorhexidine Gluconate Cloth  6 each Topical Daily  . feeding supplement  1 Container Oral BID BM  . fluticasone  1 spray Each Nare Daily  . heparin  5,000 Units Subcutaneous Q8H  . mirtazapine  15 mg Oral QHS  . multivitamin with minerals  1 tablet Oral Daily  . OLANZapine  2.5 mg Oral QHS  . tamsulosin  0.4 mg Oral Daily   Continuous Infusions: . sodium chloride 75 mL/hr at 05/10/20 0323  . cefTRIAXone (ROCEPHIN)  IV 1 g (05/09/20 2042)   PRN Meds:.acetaminophen **OR** acetaminophen, ondansetron **OR** ondansetron (ZOFRAN) IV, polyethylene glycol, senna-docusate  Antimicrobials: Anti-infectives (From admission, onward)   Start     Dose/Rate Route Frequency Ordered Stop   05/08/20 0145  cefTRIAXone (ROCEPHIN) 1 g in sodium chloride 0.9 % 100 mL IVPB  Status:  Discontinued       Note to Pharmacy: pharamacy to adjust timing for next dose.   1 g 200 mL/hr over 30 Minutes Intravenous Every 24 hours 05/08/20 0142 05/08/20 0145   05/07/20 2015  cefTRIAXone (ROCEPHIN) 1 g in sodium chloride 0.9 % 100 mL  IVPB        1 g 200 mL/hr over 30 Minutes Intravenous Every 24 hours 05/07/20 2010         I have personally reviewed the following labs and images: CBC: Recent Labs  Lab 05/07/20 1841 05/08/20 0605 05/09/20 0420 05/10/20 0528  WBC 18.3* 11.1* 13.4* 17.1*  NEUTROABS 14.6*  --   --   --   HGB 15.1 13.8 12.8* 13.0  HCT 46.9 40.5 39.3 40.3  MCV 95.9 93.1 95.2 97.6  PLT 145* 96* 123* 155   BMP &GFR Recent Labs  Lab 05/07/20 1841 05/08/20 0605 05/09/20 0420 05/10/20 0528  NA 133* 139 140 140  K 4.1 3.7 3.9 4.0  CL 97* 109 105 105  CO2 20* _0 GLUCOSE 134* 89 105* 104*  BUN 83* 41* 15 17  CREATININE 2.82* 1.13 0.85 0.85  CALCIUM 8.8* 7.7* 7.3* 7.4*  MG  --   --  2.2 2.0  PHOS  --   --  1.4*  --    Estimated Creatinine Clearance: 89 mL/min (by C-G formula based on SCr of 0.85 mg/dL). Liver & Pancreas: Recent Labs  Lab 05/07/20 1841 05/09/20 0420 05/10/20 0528  AST 42* 54* 76*  ALT 52* 55* 96*  ALKPHOS 111 81 102  BILITOT 0.9 0.8 0.6  PROT 6.7 5.1* 5.1*  ALBUMIN 3.1* 2.2* 2.1*   No results for input(s): LIPASE, AMYLASE in the last 168 hours. No results for input(s): AMMONIA in the last 168 hours. Diabetic: No results for input(s): HGBA1C in the last 72 hours. No results for input(s): GLUCAP in the last 168 hours. Cardiac Enzymes: No results for input(s): CKTOTAL, CKMB, CKMBINDEX, TROPONINI in the last 168 hours. No results for input(s): PROBNP in the last 8760 hours. Coagulation Profile: Recent Labs  Lab 05/07/20 2003  INR 1.2   Thyroid Function Tests: No results for input(s): TSH, T4TOTAL, FREET4, T3FREE, THYROIDAB in the last 72 hours. Lipid Profile: No results for input(s): CHOL, HDL, LDLCALC, TRIG, CHOLHDL, LDLDIRECT in the last 72 hours. Anemia Panel: No results for input(s): VITAMINB12, FOLATE, FERRITIN, TIBC, IRON, RETICCTPCT in the last 72 hours. Urine analysis:    Component Value Date/Time   COLORURINE YELLOW 05/07/2020 1945    APPEARANCEUR HAZY (A) 05/07/2020 1945   LABSPEC 1.016 05/07/2020 1945   PHURINE 5.0 05/07/2020 1945   GLUCOSEU NEGATIVE 05/07/2020 1945   HGBUR MODERATE (A) 05/07/2020 1945   BILIRUBINUR NEGATIVE 05/07/2020 1945   KETONESUR NEGATIVE 05/07/2020 1945   PROTEINUR NEGATIVE 05/07/2020 1945   NITRITE NEGATIVE 05/07/2020 1945   LEUKOCYTESUR SMALL (A) 05/07/2020 1945   Sepsis Labs: Invalid input(s): PROCALCITONIN, [place]  Microbiology: Recent Results (from the past 240 hour(s))  Blood Cultures (routine x 2)     Status: None (Preliminary result)   Collection Time: 05/07/20  7:45 PM   Specimen: BLOOD RIGHT FOREARM  Result Value Ref Range Status   Specimen Description   Final    BLOOD RIGHT FOREARM Performed at [facility] [address], [place], [place] [address]    Special Requests   Final    BOTTLES DRAWN AEROBIC AND ANAEROBIC Blood Culture results may not be optimal due to an inadequate volume of blood received in culture bottles Performed at [facility] [address], [place], [place] [address]    Culture   Final    NO GROWTH 3 DAYS Performed at [facility], [place] [address], [place],  [address]    Report Status PENDING  Incomplete  Resp Panel by RT PCR (RSV, Flu A&B, Covid) - Nasopharyngeal Swab     Status: None   Collection Time: 05/07/20  7:45 PM   Specimen: Nasopharyngeal Swab  Result Value Ref Range Status   SARS Coronavirus 2 by RT PCR NEGATIVE NEGATIVE Final    Comment: (NOTE) SARS-CoV-2 target nucleic acids are NOT DETECTED.  The SARS-CoV-2 RNA is generally detectable in upper respiratoy specimens during the acute phase of infection. The lowest concentration of SARS-CoV-2 viral copies this assay can detect is 131 copies/mL. A negative result does not preclude SARS-Cov-2 infection and should not be used as the sole basis for treatment or other patient management decisions. A negative result may occur with    improper specimen collection/handling, submission of specimen other than nasopharyngeal swab, presence of viral mutation(s) within the areas targeted by this assay, and inadequate number of viral copies (<131 copies/mL). A negative result must be combined  with clinical observations, patient history, and epidemiological information. The expected result is Negative.  Fact Sheet for Patients:  PinkCheek.be  Fact Sheet for Healthcare Providers:  GravelBags.it  This test is no t yet approved or cleared by the [place] FDA and  has been authorized for detection and/or diagnosis of SARS-CoV-2 by FDA under an Emergency Use Authorization (EUA). This EUA will remain  in effect (meaning this test can be used) for the duration of the COVID-19 declaration under Section 564(b)(1) of the Act, 21 U.S.C. section 360bbb-3(b)(1), unless the authorization is terminated or revoked sooner.     Influenza A by PCR NEGATIVE NEGATIVE Final   Influenza B by PCR NEGATIVE NEGATIVE Final    Comment: (NOTE) The Xpert Xpress SARS-CoV-2/FLU/RSV assay is intended as an aid in  the diagnosis of influenza from Nasopharyngeal swab specimens and  should not be used as a sole basis for treatment. Nasal washings and  aspirates are unacceptable for Xpert Xpress SARS-CoV-2/FLU/RSV  testing.  Fact Sheet for Patients: PinkCheek.be  Fact Sheet for Healthcare Providers: GravelBags.it  This test is not yet approved or cleared by the [place] FDA and  has been authorized for detection and/or diagnosis of SARS-CoV-2 by  FDA under an Emergency Use Authorization (EUA). This EUA will remain  in effect (meaning this test can be used) for the duration of the  Covid-19 declaration under Section 564(b)(1) of the Act, 21  U.S.C. section 360bbb-3(b)(1), unless the authorization is  terminated or revoked.     Respiratory Syncytial Virus by PCR NEGATIVE NEGATIVE Final    Comment: (NOTE) Fact Sheet for Patients: PinkCheek.be  Fact Sheet for Healthcare Providers: GravelBags.it  This test is not yet approved or cleared by the [place] FDA and  has been authorized for detection and/or diagnosis of SARS-CoV-2 by  FDA under an Emergency Use Authorization (EUA). This EUA will remain  in effect (meaning this test can be used) for the duration of the  COVID-19 declaration under Section 564(b)(1) of the Act, 21 U.S.C.  section 360bbb-3(b)(1), unless the authorization is terminated or  revoked. Performed at [facility], [place] [address] [place], [place] [address]   Blood Cultures (routine x 2)     Status: None (Preliminary result)   Collection Time: 05/07/20  8:01 PM   Specimen: BLOOD  Result Value Ref Range Status   Specimen Description   Final    BLOOD LEFT ANTECUBITAL Performed at [facility] [address] [place], [place] [address]    Special Requests   Final    BOTTLES DRAWN AEROBIC AND ANAEROBIC Blood Culture results may not be optimal due to an excessive volume of blood received in culture bottles Performed at [facility] [address], [place], [place] [address]    Culture   Final    NO GROWTH 3 DAYS Performed at [facility], [place] [address] [place],  [address]    Report Status PENDING  Incomplete    Radiology Studies: No results found.    Marguerita Stapp T. [place]  If 7PM-7AM, please contact night-coverage [email or web address] 05/10/2020, 10:40 AM

## 2020-05-11 ENCOUNTER — Inpatient Hospital Stay (HOSPITAL_COMMUNITY): Payer: Medicare Other

## 2020-05-11 LAB — CBC WITH DIFFERENTIAL/PLATELET
Abs Immature Granulocytes: 1.18 10*3/uL — ABNORMAL HIGH (ref 0.00–0.07)
Basophils Absolute: 0.1 10*3/uL (ref 0.0–0.1)
Basophils Relative: 1 %
Eosinophils Absolute: 0.5 10*3/uL (ref 0.0–0.5)
Eosinophils Relative: 4 %
HCT: 39.7 % (ref 39.0–52.0)
Hemoglobin: 12.8 g/dL — ABNORMAL LOW (ref 13.0–17.0)
Immature Granulocytes: 8 %
Lymphocytes Relative: 15 %
Lymphs Abs: 2.2 10*3/uL (ref 0.7–4.0)
MCH: 31.1 pg (ref 26.0–34.0)
MCHC: 32.2 g/dL (ref 30.0–36.0)
MCV: 96.4 fL (ref 80.0–100.0)
Monocytes Absolute: 0.9 10*3/uL (ref 0.1–1.0)
Monocytes Relative: 6 %
Neutro Abs: 9.7 10*3/uL — ABNORMAL HIGH (ref 1.7–7.7)
Neutrophils Relative %: 66 %
Platelets: 195 10*3/uL (ref 150–400)
RBC: 4.12 MIL/uL — ABNORMAL LOW (ref 4.22–5.81)
RDW: 13.6 % (ref 11.5–15.5)
WBC: 14.6 10*3/uL — ABNORMAL HIGH (ref 4.0–10.5)
nRBC: 0 % (ref 0.0–0.2)

## 2020-05-11 LAB — COMPREHENSIVE METABOLIC PANEL
ALT: 121 U/L — ABNORMAL HIGH (ref 0–44)
AST: 85 U/L — ABNORMAL HIGH (ref 15–41)
Albumin: 2 g/dL — ABNORMAL LOW (ref 3.5–5.0)
Alkaline Phosphatase: 105 U/L (ref 38–126)
Anion gap: 9 (ref 5–15)
BUN: 15 mg/dL (ref 8–23)
CO2: 23 mmol/L (ref 22–32)
Calcium: 7.5 mg/dL — ABNORMAL LOW (ref 8.9–10.3)
Chloride: 106 mmol/L (ref 98–111)
Creatinine, Ser: 0.67 mg/dL (ref 0.61–1.24)
GFR, Estimated: >60 mL/min (ref >60)
Glucose, Bld: 98 mg/dL (ref 70–99)
Potassium: 4 mmol/L (ref 3.5–5.1)
Sodium: 138 mmol/L (ref 135–145)
Total Bilirubin: 0.8 mg/dL (ref 0.3–1.2)
Total Protein: 5.1 g/dL — ABNORMAL LOW (ref 6.5–8.1)

## 2020-05-11 LAB — URINE CULTURE: Culture: <10000 — AB

## 2020-05-11 LAB — MAGNESIUM: Magnesium: 2 mg/dL (ref 1.7–2.4)

## 2020-05-11 LAB — PHOSPHORUS: Phosphorus: 2.1 mg/dL — ABNORMAL LOW (ref 2.5–4.6)

## 2020-05-11 NOTE — Progress Notes (Signed)
PROGRESS NOTE  Kalep Full ACZ:[id number] DOB: [birth year]   PCP: Crist Infante, MD  Patient is from: Group home  DOA: 05/07/2020 LOS: 3  Chief complaints: Abnormal labs   Brief Narrative / Interim history: 64 year old male with history of intellectual disability, urinary incontinence, anxiety and depression presented to ED for evaluation of abnormal labs including elevated troponin and leukocytosis.  Had generalized weakness, poor appetite and mild suprapubic tenderness.  In ED, febrile to 100.5.  WBC 18.3 with left shift.  Platelet 145. Cr 2.82.  BUN 83.  Bicarb 20.  AG 16.  Na 133.  AST 42.  ALT 2. LA 4.0.  UA concerning for UTI.  RVP negative.  Bladder scan> 1000.  He had 1.7 L UOP after Foley insertion.  CT scan showed BPH but no hydronephrosis or other acute finding.  Cultures obtained.  Received IV fluid.  Started on IV ceftriaxone.  Admitted for severe sepsis due to UTI and acute urinary retention with bladder over distention.  Blood cultures negative.  Urine culture ordered but was not sent. Urine culture on 11/6 with insignificant growth. Group home cannot take him back with Foley catheter. PT recommended SNF.   Subjective: Seen and examined earlier this morning. No major events overnight of this morning. No complaints. Denies chest pain, dyspnea, GI or UTI symptoms.  Objective: Vitals:   05/10/20 0515 05/10/20 1442 05/10/20 2154 05/11/20 0520  BP: 123/75 114/71 127/83 118/73  Pulse: 82 100 90   Resp: _0 Temp: 97.9 F (36.6 C) 97.7 F (36.5 C) 98.3 F (36.8 C) 98.4 F (36.9 C)  TempSrc: Oral Oral Oral Oral  SpO2: 100% 98% 97% 96%  Weight:      Height:        Intake/Output Summary (Last 24 hours) at 05/11/2020 1113 Last data filed at 05/11/2020 0856 Gross per 24 hour  Intake 1984.53 ml  Output 2500 ml  Net -515.47 ml   Filed Weights   05/07/20 2012  Weight: 72.6 kg    Examination:  GENERAL: No apparent distress.  Nontoxic. HEENT: MMM.  Vision and  hearing grossly intact.  NECK: Supple.  No apparent JVD.  RESP: On room air. No IWOB.  Fair aeration bilaterally. CVS:  RRR. Heart sounds normal.  ABD/GI/GU: BS+. Abd soft, NTND.  MSK/EXT:  Moves extremities. No apparent deformity. No edema.  SKIN: no apparent skin lesion or wound NEURO: Awake, alert and oriented appropriately.  No apparent focal neuro deficit. PSYCH: Calm. Normal affect.  Procedures:  None  Microbiology summarized: 11/3-RVP negative. 11/3-blood cultures NGTD. 11/3-urine culture ordered but not sent. 11/6-urine culture pending.  Assessment & Plan: Severe sepsis secondary to UTI: POA.  Met criteria for severe sepsis with fever, leukocytosis, UTI and evidence of endorgan damage including lactic acidosis and AKI.  Blood cultures NGTD.  Urine culture was not sent on admission. Urine culture from 11/6 with insignificant gross. Sepsis physiology resolving. -IV ceftriaxone 11/3-11/7  Acute urinary retention: Had about 1.7 L UOP after Foley insertion in ED.  Likely due to BPH.  CT renal study without hydronephrosis but BPH. PSA elevated to 12.0 but unreliable in the setting of acute retention.  Discussed with urology, Dr. Junious Silk who recommended voiding trial in 7 days, discontinuing Myrbetriq and outpatient follow-up in 2 to 3 weeks and repeat PSA in about 6 weeks.  -Continue holding Myrbetriq.  -Voiding trial in 7 days -Continue Flomax  AKI/azotemia: Cr 2.82 (admit)>>>>0.67.  Azotemia resolved.  Likely a combination of poor  p.o. intake, ATN due to sepsis and possible obstructive uropathy although he does not have hydronephrosis on CT.  AKI resolved. -Avoid nephrotoxic meds -Continue Foley catheter as above -Monitor intermittently  Elevated liver enzymes: Due to sepsis?  LFT slowly uptrending.  Abdominal exam benign. HIV and acute hepatitis panel nonreactive. He is on Zyprexa but low dose. -Follow RUQ ultrasound.  Anxiety/depression: Stable -Continue home  medications-olanzapine and Remeron  Hypophosphatemia: Replenished and improving. -Recheck.  Generalized weakness -OOB/PT/OT eval  Leukocytosis/bandemia: Likely due to #1. Improving. -Continue monitoring  Poor appetite: Likely due to acute illness. -Appreciate input by dietitian -Continue Remeron and multivitamin Body mass index is 22.96 kg/m. Nutrition Problem: Inadequate oral intake Etiology: poor appetite Signs/Symptoms: per patient/family report Interventions: Magic cup, Boost Breeze   DVT prophylaxis:  heparin injection 5,000 Units Start: 05/08/20 0600  Code Status: Full code Family Communication: Updated patient's sister over the phone. Status is: Inpatient  Remains inpatient appropriate because:Persistent severe electrolyte disturbances, IV treatments appropriate due to intensity of illness or inability to take PO and Inpatient level of care appropriate due to severity of illness   Dispo: The patient is from: Group home              Anticipated d/c is to: SNF              Anticipated d/c date is: 2 days              Patient currently is not medically stable to d/c.       Consultants:  Urology, Dr. Junious Silk over the phone   Sch Meds:  Scheduled Meds: . Chlorhexidine Gluconate Cloth  6 each Topical Daily  . feeding supplement  1 Container Oral BID BM  . fluticasone  1 spray Each Nare Daily  . heparin  5,000 Units Subcutaneous Q8H  . mirtazapine  15 mg Oral QHS  . multivitamin with minerals  1 tablet Oral Daily  . OLANZapine  2.5 mg Oral QHS  . tamsulosin  0.4 mg Oral Daily   Continuous Infusions: . sodium chloride 75 mL/hr at 05/11/20 0143  . cefTRIAXone (ROCEPHIN)  IV 1 g (05/10/20 2010)   PRN Meds:.acetaminophen **OR** acetaminophen, ondansetron **OR** ondansetron (ZOFRAN) IV, polyethylene glycol, senna-docusate  Antimicrobials: Anti-infectives (From admission, onward)   Start     Dose/Rate Route Frequency Ordered Stop   05/08/20 0145   cefTRIAXone (ROCEPHIN) 1 g in sodium chloride 0.9 % 100 mL IVPB  Status:  Discontinued       Note to Pharmacy: pharamacy to adjust timing for next dose.   1 g 200 mL/hr over 30 Minutes Intravenous Every 24 hours 05/08/20 0142 05/08/20 0145   05/07/20 2015  cefTRIAXone (ROCEPHIN) 1 g in sodium chloride 0.9 % 100 mL IVPB        1 g 200 mL/hr over 30 Minutes Intravenous Every 24 hours 05/07/20 2010         I have personally reviewed the following labs and images: CBC: Recent Labs  Lab 05/07/20 1841 05/08/20 0605 05/09/20 0420 05/10/20 0528 05/11/20 0517  WBC 18.3* 11.1* 13.4* 17.1* 14.6*  NEUTROABS 14.6*  --   --   --  9.7*  HGB 15.1 13.8 12.8* 13.0 12.8*  HCT 46.9 40.5 39.3 40.3 39.7  MCV 95.9 93.1 95.2 97.6 96.4  PLT 145* 96* 123* 155 195   BMP &GFR Recent Labs  Lab 05/07/20 1841 05/08/20 0605 05/09/20 0420 05/10/20 0528 05/10/20 1052 05/11/20 0517  NA 133* 139 140 140  --  138  K 4.1 3.7 3.9 4.0  --  4.0  CL 97* 109 105 105  --  106  CO2 20* _0 --  23  GLUCOSE 134* 89 105* 104*  --  98  BUN 83* 41* 15 17  --  15  CREATININE 2.82* 1.13 0.85 0.85  --  0.67  CALCIUM 8.8* 7.7* 7.3* 7.4*  --  7.5*  MG  --   --  2.2 2.0  --  2.0  PHOS  --   --  1.4*  --  1.9* 2.1*   Estimated Creatinine Clearance: 94.5 mL/min (by C-G formula based on SCr of 0.67 mg/dL). Liver & Pancreas: Recent Labs  Lab 05/07/20 1841 05/09/20 0420 05/10/20 0528 05/11/20 0517  AST 42* 54* 76* 85*  ALT 52* 55* 96* 121*  ALKPHOS 111 81 102 105  BILITOT 0.9 0.8 0.6 0.8  PROT 6.7 5.1* 5.1* 5.1*  ALBUMIN 3.1* 2.2* 2.1* 2.0*   No results for input(s): LIPASE, AMYLASE in the last 168 hours. No results for input(s): AMMONIA in the last 168 hours. Diabetic: No results for input(s): HGBA1C in the last 72 hours. No results for input(s): GLUCAP in the last 168 hours. Cardiac Enzymes: No results for input(s): CKTOTAL, CKMB, CKMBINDEX, TROPONINI in the last 168 hours. No results for input(s):  PROBNP in the last 8760 hours. Coagulation Profile: Recent Labs  Lab 05/07/20 2003  INR 1.2   Thyroid Function Tests: No results for input(s): TSH, T4TOTAL, FREET4, T3FREE, THYROIDAB in the last 72 hours. Lipid Profile: No results for input(s): CHOL, HDL, LDLCALC, TRIG, CHOLHDL, LDLDIRECT in the last 72 hours. Anemia Panel: No results for input(s): VITAMINB12, FOLATE, FERRITIN, TIBC, IRON, RETICCTPCT in the last 72 hours. Urine analysis:    Component Value Date/Time   COLORURINE YELLOW 05/07/2020 1945   APPEARANCEUR HAZY (A) 05/07/2020 1945   LABSPEC 1.016 05/07/2020 1945   PHURINE 5.0 05/07/2020 1945   GLUCOSEU NEGATIVE 05/07/2020 1945   HGBUR MODERATE (A) 05/07/2020 1945   BILIRUBINUR NEGATIVE 05/07/2020 1945   KETONESUR NEGATIVE 05/07/2020 1945   PROTEINUR NEGATIVE 05/07/2020 1945   NITRITE NEGATIVE 05/07/2020 1945   LEUKOCYTESUR SMALL (A) 05/07/2020 1945   Sepsis Labs: Invalid input(s): PROCALCITONIN, [place]  Microbiology: Recent Results (from the past 240 hour(s))  Blood Cultures (routine x 2)     Status: None (Preliminary result)   Collection Time: 05/07/20  7:45 PM   Specimen: BLOOD RIGHT FOREARM  Result Value Ref Range Status   Specimen Description   Final    BLOOD RIGHT FOREARM Performed at [facility] [address] [place], [place] [address]    Special Requests   Final    BOTTLES DRAWN AEROBIC AND ANAEROBIC Blood Culture results may not be optimal due to an inadequate volume of blood received in culture bottles Performed at [facility] [address], [place], [place] [address]    Culture   Final    NO GROWTH 4 DAYS Performed at [facility], [place] [address], [place], [place] [address]    Report Status PENDING  Incomplete  Resp Panel by RT PCR (RSV, Flu A&B, Covid) - Nasopharyngeal Swab     Status: None   Collection Time: 05/07/20  7:45 PM   Specimen: Nasopharyngeal Swab  Result Value Ref Range Status    SARS Coronavirus 2 by RT PCR NEGATIVE NEGATIVE Final    Comment: (NOTE) SARS-CoV-2 target nucleic acids are NOT DETECTED.  The  SARS-CoV-2 RNA is generally detectable in upper respiratoy specimens during the acute phase of infection. The lowest concentration of SARS-CoV-2 viral copies this assay can detect is 131 copies/mL. A negative result does not preclude SARS-Cov-2 infection and should not be used as the sole basis for treatment or other patient management decisions. A negative result may occur with  improper specimen collection/handling, submission of specimen other than nasopharyngeal swab, presence of viral mutation(s) within the areas targeted by this assay, and inadequate number of viral copies (<131 copies/mL). A negative result must be combined with clinical observations, patient history, and epidemiological information. The expected result is Negative.  Fact Sheet for Patients:  PinkCheek.be  Fact Sheet for Healthcare Providers:  GravelBags.it  This test is no t yet approved or cleared by the [place] FDA and  has been authorized for detection and/or diagnosis of SARS-CoV-2 by FDA under an Emergency Use Authorization (EUA). This EUA will remain  in effect (meaning this test can be used) for the duration of the COVID-19 declaration under Section 564(b)(1) of the Act, 21 U.S.C. section 360bbb-3(b)(1), unless the authorization is terminated or revoked sooner.     Influenza A by PCR NEGATIVE NEGATIVE Final   Influenza B by PCR NEGATIVE NEGATIVE Final    Comment: (NOTE) The Xpert Xpress SARS-CoV-2/FLU/RSV assay is intended as an aid in  the diagnosis of influenza from Nasopharyngeal swab specimens and  should not be used as a sole basis for treatment. Nasal washings and  aspirates are unacceptable for Xpert Xpress SARS-CoV-2/FLU/RSV  testing.  Fact Sheet for  Patients: PinkCheek.be  Fact Sheet for Healthcare Providers: GravelBags.it  This test is not yet approved or cleared by the [place] FDA and  has been authorized for detection and/or diagnosis of SARS-CoV-2 by  FDA under an Emergency Use Authorization (EUA). This EUA will remain  in effect (meaning this test can be used) for the duration of the  Covid-19 declaration under Section 564(b)(1) of the Act, 21  U.S.C. section 360bbb-3(b)(1), unless the authorization is  terminated or revoked.    Respiratory Syncytial Virus by PCR NEGATIVE NEGATIVE Final    Comment: (NOTE) Fact Sheet for Patients: PinkCheek.be  Fact Sheet for Healthcare Providers: GravelBags.it  This test is not yet approved or cleared by the [place] FDA and  has been authorized for detection and/or diagnosis of SARS-CoV-2 by  FDA under an Emergency Use Authorization (EUA). This EUA will remain  in effect (meaning this test can be used) for the duration of the  COVID-19 declaration under Section 564(b)(1) of the Act, 21 U.S.C.  section 360bbb-3(b)(1), unless the authorization is terminated or  revoked. Performed at [facility], [place] [address], [place], [place] [address]   Blood Cultures (routine x 2)     Status: None (Preliminary result)   Collection Time: 05/07/20  8:01 PM   Specimen: BLOOD  Result Value Ref Range Status   Specimen Description   Final    BLOOD LEFT ANTECUBITAL Performed at [facility] [address] [place], [place] [address]    Special Requests   Final    BOTTLES DRAWN AEROBIC AND ANAEROBIC Blood Culture results may not be optimal due to an excessive volume of blood received in culture bottles Performed at [facility] [address], [place], [place] [address]    Culture   Final    NO GROWTH 4 DAYS Performed  at [facility], [place],  [place] [address]    Report Status PENDING  Incomplete  Culture, Urine     Status: Abnormal   Collection Time: 05/10/20 11:35 AM   Specimen: Urine, Random  Result Value Ref Range Status   Specimen Description   Final    URINE, RANDOM Performed at [facility] [address] [place] [address] [place], [place] [address]    Special Requests   Final    NONE Performed at [facility], [place] [address], [place], [place] [address]    Culture (A)  Final    <10,000 COLONIES/mL INSIGNIFICANT GROWTH Performed at [facility] [address], [place], [place] [address]    Report Status 05/11/2020 FINAL  Final    Radiology Studies: No results found.    Iram Astorino T. Kossuth  If 7PM-7AM, please contact night-coverage [email or web address] 05/11/2020, 11:13 AM

## 2020-05-12 LAB — CBC WITH DIFFERENTIAL/PLATELET
Abs Immature Granulocytes: 0.99 10*3/uL — ABNORMAL HIGH (ref 0.00–0.07)
Basophils Absolute: 0.1 10*3/uL (ref 0.0–0.1)
Basophils Relative: 1 %
Eosinophils Absolute: 0.4 10*3/uL (ref 0.0–0.5)
Eosinophils Relative: 3 %
HCT: 39.2 % (ref 39.0–52.0)
Hemoglobin: 12.7 g/dL — ABNORMAL LOW (ref 13.0–17.0)
Immature Granulocytes: 7 %
Lymphocytes Relative: 16 %
Lymphs Abs: 2.4 10*3/uL (ref 0.7–4.0)
MCH: 31 pg (ref 26.0–34.0)
MCHC: 32.4 g/dL (ref 30.0–36.0)
MCV: 95.6 fL (ref 80.0–100.0)
Monocytes Absolute: 1 10*3/uL (ref 0.1–1.0)
Monocytes Relative: 7 %
Neutro Abs: 9.8 10*3/uL — ABNORMAL HIGH (ref 1.7–7.7)
Neutrophils Relative %: 66 %
Platelets: 213 10*3/uL (ref 150–400)
RBC: 4.1 MIL/uL — ABNORMAL LOW (ref 4.22–5.81)
RDW: 13.5 % (ref 11.5–15.5)
WBC: 14.8 10*3/uL — ABNORMAL HIGH (ref 4.0–10.5)
nRBC: 0 % (ref 0.0–0.2)

## 2020-05-12 LAB — COMPREHENSIVE METABOLIC PANEL
ALT: 107 U/L — ABNORMAL HIGH (ref 0–44)
AST: 61 U/L — ABNORMAL HIGH (ref 15–41)
Albumin: 1.9 g/dL — ABNORMAL LOW (ref 3.5–5.0)
Alkaline Phosphatase: 105 U/L (ref 38–126)
Anion gap: 7 (ref 5–15)
BUN: 17 mg/dL (ref 8–23)
CO2: 22 mmol/L (ref 22–32)
Calcium: 7.4 mg/dL — ABNORMAL LOW (ref 8.9–10.3)
Chloride: 107 mmol/L (ref 98–111)
Creatinine, Ser: 0.73 mg/dL (ref 0.61–1.24)
GFR, Estimated: >60 mL/min (ref >60)
Glucose, Bld: 100 mg/dL — ABNORMAL HIGH (ref 70–99)
Potassium: 4.2 mmol/L (ref 3.5–5.1)
Sodium: 136 mmol/L (ref 135–145)
Total Bilirubin: 0.4 mg/dL (ref 0.3–1.2)
Total Protein: 4.9 g/dL — ABNORMAL LOW (ref 6.5–8.1)

## 2020-05-12 LAB — CULTURE, BLOOD (ROUTINE X 2)
Culture: NO GROWTH
Culture: NO GROWTH

## 2020-05-12 LAB — MAGNESIUM: Magnesium: 2 mg/dL (ref 1.7–2.4)

## 2020-05-12 LAB — PHOSPHORUS: Phosphorus: 2.5 mg/dL (ref 2.5–4.6)

## 2020-05-12 MED ORDER — POLYETHYLENE GLYCOL 3350 17 G PO PACK: 17.0000 g | PACK | Freq: Two times a day (BID) | ORAL | 0 refills | Status: AC | PRN

## 2020-05-12 NOTE — Progress Notes (Signed)
PROGRESS NOTE  Paul Moreno ZOX:[id number] DOB: 1956/02/28 DOA: 05/07/2020 PCP: Crist Infante, MD   LOS: 4 days   Brief narrative: As per HPI,  64 year old male with history of intellectual disability, urinary incontinence, anxiety and depression presented to ED for evaluation of abnormal labs including elevated troponin and leukocytosis.  He also presented with generalized weakness poor appetite and suprapubic tenderness.  He did have fever of 100.5 F with elevated leukocytosis on presentation.  Anion gap was low.  Urinalysis was concerning for UTI.  Bladder scan showed more than 1000 mL of urine and 1.7 L urine output was noted after Foley insertion.  CT scan showed benign prostatic hyper atrophy but no hydronephrosis.  Patient was then admitted to hospital for severe sepsis secondary to UTI with acute urinary retention.  Blood cultures are negative so far.  Urine culture on 11/6 with insignificant growth. Group home cannot take him back with Foley catheter. PT recommended SNF.  Assessment/Plan:  Principal Problem:   Sepsis secondary to UTI [facility]) Active Problems:   Acute urinary retention  Severe sepsis/septic shock secondary to UTI: POA.    Patient met severe sepsis  criteria on presentation with fever, leukocytosis, UTI and lactic acidosis with acute kidney injury.  Blood cultures negative so far.  Urine culture with insignificant growth.  Improved with sepsis physiology.  Patient received IV ceftriaxone for 5 days will discontinue.  Acute urinary retention: Likely secondary to BPH.  Required Foley catheter placement.  PSA elevated but unreliable with acute urinary retention.  Previous provider had discussed with urology, Dr. Junious Silk who recommended voiding trial in 7 days, discontinuing Myrbetriq and outpatient follow-up in 2 to 3 weeks and repeat PSA in about 6 weeks.   Continue to hold Myrbetriq on discharge.  Continue Flomax.  AKI/azotemia: Likely multifactorial secondary to poor  oral intake, ATN secondary to sepsis and obstructive uropathy.  Cr was 2.82 (admit) on presentation which has improved to 0.67.  Improved.   Continue Foley catheter at this time.  Will need voiding trial as outpatient.  Elevated liver enzymes: Hepatitis and HIV negative.  Could be from sepsis.  Mild elevated.  Trending down.  Right upper quadrant ultrasound without acute findings.  Anxiety/depression: On olanzapine and Remeron  Hypophosphatemia: Improved.  Generalized weakness Physical therapy occupational therapy evaluation.  Recommend skilled nursing facility placement at this time.  Mild cognitive decline.  Supportive care.  Leukocytosis/bandemia: Improving.  Likely secondary to infection.  Latest WBC count of 14.8  Low appetite likely due to acute illness. Dietary on board.  Continue Remeron, multivitamin.  Continue Magic cup, Boost Breeze    DVT prophylaxis: heparin injection 5,000 Units Start: 05/08/20 0600   Code Status: Full code  Family Communication: None today  Status is: Inpatient  Remains inpatient appropriate because:IV treatments appropriate due to intensity of illness or inability to take PO and Inpatient level of care appropriate due to severity of illness, awaiting for skilled nursing facility placement   Dispo: The patient is from: Group home              Anticipated d/c is to: SNF              Anticipated d/c date is: 2 days              Patient currently is medically stable to d/c.   Consultants:  None  Procedures:  Foley catheter placement  Antibiotics:  . Rocephin 05/08/2020>  Anti-infectives (From admission, onward)   Start  Dose/Rate Route Frequency Ordered Stop   05/08/20 0145  cefTRIAXone (ROCEPHIN) 1 g in sodium chloride 0.9 % 100 mL IVPB  Status:  Discontinued       Note to Pharmacy: pharamacy to adjust timing for next dose.   1 g 200 mL/hr over 30 Minutes Intravenous Every 24 hours 05/08/20 0142 05/08/20 0145   05/07/20 2015   cefTRIAXone (ROCEPHIN) 1 g in sodium chloride 0.9 % 100 mL IVPB        1 g 200 mL/hr over 30 Minutes Intravenous Every 24 hours 05/07/20 2010        Subjective: Today, patient was seen and examined at bedside.  Today, patient feels okay.  Denies any pain, nausea, vomiting, shortness of breath or fever.  Objective: Vitals:   05/12/20 0224 05/12/20 0534  BP:  112/69  Pulse:    Resp:  18  Temp: 98.6 F (37 C) 98.5 F (36.9 C)  SpO2:  95%    Intake/Output Summary (Last 24 hours) at 05/12/2020 0716 Last data filed at 05/12/2020 0531 Gross per 24 hour  Intake 939.93 ml  Output 2800 ml  Net -1860.07 ml   Filed Weights   05/07/20 2012  Weight: 72.6 kg   Body mass index is 22.96 kg/m.   Physical Exam:  GENERAL: Patient is alert awake and communicative, not in obvious distress. HENT: No scleral pallor or icterus. Pupils equally reactive to light. Oral mucosa is moist NECK: is supple, no gross swelling noted. CHEST: Clear to auscultation. No crackles or wheezes.  Diminished breath sounds bilaterally. CVS: S1 and S2 heard, no murmur. Regular rate and rhythm.  ABDOMEN: Soft, non-tender, bowel sounds are present.  On a Foley catheter EXTREMITIES: No edema. CNS: Cranial nerves are intact. No focal motor deficits. SKIN: warm and dry without rashes.  Data Review: I have personally reviewed the following laboratory data and studies,  CBC: Recent Labs  Lab 05/07/20 1841 05/07/20 1841 05/08/20 0605 05/09/20 0420 05/10/20 0528 05/11/20 0517 05/12/20 0421  WBC 18.3*   < > 11.1* 13.4* 17.1* 14.6* 14.8*  NEUTROABS 14.6*  --   --   --   --  9.7* 9.8*  HGB 15.1   < > 13.8 12.8* 13.0 12.8* 12.7*  HCT 46.9   < > 40.5 39.3 40.3 39.7 39.2  MCV 95.9   < > 93.1 95.2 97.6 96.4 95.6  PLT 145*   < > 96* 123* 155 195 213   < > = values in this interval not displayed.   Basic Metabolic Panel: Recent Labs  Lab 05/08/20 0605 05/09/20 0420 05/10/20 0528 05/10/20 1052 05/11/20 0517  05/12/20 0421  NA 139 140 140  --  138 136  K 3.7 3.9 4.0  --  4.0 4.2  CL 109 105 105  --  106 107  CO2 $Re'22 24 26  'bJH$ --  23 22  GLUCOSE 89 105* 104*  --  98 100*  BUN 41* 15 17  --  15 17  CREATININE 1.13 0.85 0.85  --  0.67 0.73  CALCIUM 7.7* 7.3* 7.4*  --  7.5* 7.4*  MG  --  2.2 2.0  --  2.0 2.0  PHOS  --  1.4*  --  1.9* 2.1* 2.5   Liver Function Tests: Recent Labs  Lab 05/07/20 1841 05/09/20 0420 05/10/20 0528 05/11/20 0517 05/12/20 0421  AST 42* 54* 76* 85* 61*  ALT 52* 55* 96* 121* 107*  ALKPHOS 111 81 102 105 105  BILITOT 0.9  0.8 0.6 0.8 0.4  PROT 6.7 5.1* 5.1* 5.1* 4.9*  ALBUMIN 3.1* 2.2* 2.1* 2.0* 1.9*   No results for input(s): LIPASE, AMYLASE in the last 168 hours. No results for input(s): AMMONIA in the last 168 hours. Cardiac Enzymes: No results for input(s): CKTOTAL, CKMB, CKMBINDEX, TROPONINI in the last 168 hours. BNP (last 3 results) No results for input(s): BNP in the last 8760 hours.  ProBNP (last 3 results) No results for input(s): PROBNP in the last 8760 hours.  CBG: No results for input(s): GLUCAP in the last 168 hours. Recent Results (from the past 240 hour(s))  Blood Cultures (routine x 2)     Status: None (Preliminary result)   Collection Time: 05/07/20  7:45 PM   Specimen: BLOOD RIGHT FOREARM  Result Value Ref Range Status   Specimen Description   Final    BLOOD RIGHT FOREARM Performed at [facility] [address] [place], [place] [address]    Special Requests   Final    BOTTLES DRAWN AEROBIC AND ANAEROBIC Blood Culture results may not be optimal due to an inadequate volume of blood received in culture bottles Performed at [facility] [address], [place], [place] [address]    Culture   Final    NO GROWTH 4 DAYS Performed at [facility], [place] [address] [place], [place] [address]    Report Status PENDING  Incomplete  Resp Panel by RT PCR (RSV, Flu A&B, Covid) - Nasopharyngeal Swab      Status: None   Collection Time: 05/07/20  7:45 PM   Specimen: Nasopharyngeal Swab  Result Value Ref Range Status   SARS Coronavirus 2 by RT PCR NEGATIVE NEGATIVE Final    Comment: (NOTE) SARS-CoV-2 target nucleic acids are NOT DETECTED.  The SARS-CoV-2 RNA is generally detectable in upper respiratoy specimens during the acute phase of infection. The lowest concentration of SARS-CoV-2 viral copies this assay can detect is 131 copies/mL. A negative result does not preclude SARS-Cov-2 infection and should not be used as the sole basis for treatment or other patient management decisions. A negative result may occur with  improper specimen collection/handling, submission of specimen other than nasopharyngeal swab, presence of viral mutation(s) within the areas targeted by this assay, and inadequate number of viral copies (<131 copies/mL). A negative result must be combined with clinical observations, patient history, and epidemiological information. The expected result is Negative.  Fact Sheet for Patients:  PinkCheek.be  Fact Sheet for Healthcare Providers:  GravelBags.it  This test is no t yet approved or cleared by the [place] FDA and  has been authorized for detection and/or diagnosis of SARS-CoV-2 by FDA under an Emergency Use Authorization (EUA). This EUA will remain  in effect (meaning this test can be used) for the duration of the COVID-19 declaration under Section 564(b)(1) of the Act, 21 U.S.C. section 360bbb-3(b)(1), unless the authorization is terminated or revoked sooner.     Influenza A by PCR NEGATIVE NEGATIVE Final   Influenza B by PCR NEGATIVE NEGATIVE Final    Comment: (NOTE) The Xpert Xpress SARS-CoV-2/FLU/RSV assay is intended as an aid in  the diagnosis of influenza from Nasopharyngeal swab specimens and  should not be used as a sole basis for treatment. Nasal washings and  aspirates are  unacceptable for Xpert Xpress SARS-CoV-2/FLU/RSV  testing.  Fact Sheet for Patients: PinkCheek.be  Fact Sheet for Healthcare Providers: GravelBags.it  This test is not yet approved or cleared by the  [place] Architectural technologist and  has been authorized for detection and/or diagnosis of SARS-CoV-2 by  FDA under an Print production planner (EUA). This EUA will remain  in effect (meaning this test can be used) for the duration of the  Covid-19 declaration under Section 564(b)(1) of the Act, 21  U.S.C. section 360bbb-3(b)(1), unless the authorization is  terminated or revoked.    Respiratory Syncytial Virus by PCR NEGATIVE NEGATIVE Final    Comment: (NOTE) Fact Sheet for Patients: PinkCheek.be  Fact Sheet for Healthcare Providers: GravelBags.it  This test is not yet approved or cleared by the [place] FDA and  has been authorized for detection and/or diagnosis of SARS-CoV-2 by  FDA under an Emergency Use Authorization (EUA). This EUA will remain  in effect (meaning this test can be used) for the duration of the  COVID-19 declaration under Section 564(b)(1) of the Act, 21 U.S.C.  section 360bbb-3(b)(1), unless the authorization is terminated or  revoked. Performed at [facility], [place] [address] [place], [place] [address]   Blood Cultures (routine x 2)     Status: None (Preliminary result)   Collection Time: 05/07/20  8:01 PM   Specimen: BLOOD  Result Value Ref Range Status   Specimen Description   Final    BLOOD LEFT ANTECUBITAL Performed at [facility] [address] [place], [place] [address]    Special Requests   Final    BOTTLES DRAWN AEROBIC AND ANAEROBIC Blood Culture results may not be optimal due to an excessive volume of blood received in culture bottles Performed at [facility] [address], [place], [place] [address]    Culture   Final    NO GROWTH 4 DAYS Performed at [facility], [place] [address], [place], [place] [address]    Report Status PENDING  Incomplete  Culture, Urine     Status: Abnormal   Collection Time: 05/10/20 11:35 AM   Specimen: Urine, Random  Result Value Ref Range Status   Specimen Description   Final    URINE, RANDOM Performed at [facility] [address], [place], [place] [address]    Special Requests   Final    NONE Performed at [facility], [place] [address] [place], [place] [address]    Culture (A)  Final    <10,000 COLONIES/mL INSIGNIFICANT GROWTH Performed at [facility] [address], [place], [place] [address]    Report Status 05/11/2020 FINAL  Final     Studies: US ABDOMEN LIMITED RUQ (LIVER/GB)  Result Date: 05/11/2020 CLINICAL DATA:  Elevated liver enzymes EXAM: ULTRASOUND ABDOMEN LIMITED RIGHT UPPER QUADRANT COMPARISON:  CT renal stone protocol 05/07/2020 FINDINGS: Gallbladder: No gallstones or wall thickening visualized. No sonographic Murphy sign noted by sonographer. Common bile duct: Diameter: 0.5 cm, within normal limits Liver: No focal lesion identified. Within normal limits in parenchymal echogenicity. Portal vein is patent on color Doppler imaging with normal direction of blood flow towards the liver. Other: None. IMPRESSION: No sonographic finding to explain the patient's abnormal liver enzymes. Electronically Signed   By: Audie Pinto M.D.   On: 05/11/2020 12:27      Flora Lipps, MD  [facility] 05/12/2020

## 2020-05-12 NOTE — Care Management Important Message (Signed)
Important Message  Patient Details IM Letter given to the Patient Name: Paul Moreno MRN: [id number] Date of Birth: 1955/09/17   Medicare Important Message Given:  Yes     Caren Macadam 05/12/2020, 12:21 PM

## 2020-05-12 NOTE — TOC Progression Note (Signed)
Transition of Care [facility]) - Progression Note    Patient Details  Name: Paul Moreno MRN: [id number] Date of Birth: [birth year]  Transition of Care [facility]) CM/SW Contact  Armanda Heritage, RN Phone Number: 05/12/2020, 2:10 PM  Clinical Narrative:    CM spoke with director for [facility] group home, Murlean Caller, regarding patient's return.  Per Rae Roam can accept patient back with Foley in place, requests [facility] support and CM notes recommendation for HHPT.  Patient's sister was updated that [facility] can accept patient back and CM discussed HHRN and PT services.  [facility] and RN.  FL2 and DC summary faxed to [facility].  Patient's sister, Waynetta Sandy, to transport patient to group home.   Expected Discharge Plan: Group Home Barriers to Discharge: No Barriers Identified  Expected Discharge Plan and Services Expected Discharge Plan: Group Home   Discharge Planning Services: CM Consult   Living arrangements for the past 2 months: Group Home Expected Discharge Date: 05/12/20               DME Arranged: N/A DME Agency: NA       HH Arranged: RN, PT HH Agency: [facility] Date HH Agency Contacted: 05/12/20 Time HH Agency Contacted: 1410 Representative spoke with at [facility] Agency: [facility]   Social Determinants of Health (SDOH) Interventions    Readmission Risk Interventions No flowsheet data found.

## 2020-05-12 NOTE — Progress Notes (Signed)
Patient discharged in care of his sister who is transporting him to the group home. Education provided on how to connect/disconnect foley bag (overnight bag and leg bag). Sister verbalized understanding, states the group home will manage cath and she will be taking patient to his follow up urology appointment. Medications reviewed and discharge paperwork given to cg. IV and tele removed. IV site WDL. No other questions or concerns at this time.

## 2020-05-12 NOTE — Discharge Summary (Addendum)
Physician Discharge Summary  Paul Moreno OIN:[id number] DOB: [birth year] DOA: 05/07/2020  PCP: Crist Infante, MD  Admit date: 05/07/2020 Discharge date: 05/12/2020  Admitted From: [facility]  Discharge disposition: Group home   Recommendations for Outpatient Follow-Up:   . Follow up with your primary care provider in one week.  . Check CBC, BMP, magnesium in the next visit . Follow-up with Dr. Junious Silk urology in 1-2 weeks. . Consider voiding trial in 1 week. If unable to remove foley catheter, keep until seen by urology.    Discharge Diagnosis:   Principal Problem:   Sepsis secondary to UTI [facility]) Active Problems:   Acute urinary retention   Discharge Condition: Improved.  Diet recommendation:  Regular.  Wound care: None.  Code status: Full.   History of Present Illness:   64 year old male with history of intellectual disability, urinary incontinence, anxiety and depression presented to ED for evaluation of abnormal labs including elevated troponin and leukocytosis.  He also presented with generalized weakness poor appetite and suprapubic tenderness.  He did have fever of 100.5 F with elevated leukocytosis on presentation.  Anion gap was low.  Urinalysis was concerning for UTI.  Bladder scan showed more than 1000 mL of urine and 1.7 L urine output was noted after Foley insertion.  CT scan showed benign prostatic hyper atrophy but no hydronephrosis.  Patient was then admitted to hospital for severe sepsis secondary to UTI with acute urinary retention.  Blood cultures are negative so far. Urine culture on 11/6 with insignificant growth.   Hospital Course:   Following conditions were addressed during hospitalization as listed below,  Severe sepsis/septic shock secondary to UTI:  Present on admission. Patient met severe sepsis  criteria on presentation with fever, leukocytosis, UTI and lactic acidosis with acute kidney injury.  Blood cultures negative so far.  Urine culture  with insignificant growth.  Improved with sepsis physiology.  Patient received IV ceftriaxone for 5 days and has completed the course at this time.  Acute urinary retention: Likely secondary to BPH.  Required Foley catheter placement.  PSA elevated but unreliable with acute urinary retention.  Previous provider had discussed with urology, Dr. Junious Silk who recommended voiding trial in 7 days,discontinuing Myrbetriqandoutpatient follow-up in 2 to 3 weeks and repeat PSA in about 6 weeks.  Continue to hold Myrbetriq on discharge.  Continue Flomax on discharge..  AKI/azotemia: Likely multifactorial secondary to poor oral intake, ATN secondary to sepsis and obstructive uropathy.  Cr was 2.82 (admit) on presentation which has improved to 0.67.  Improved.  Continue Foley catheter at this time.  Will need voiding trial as outpatient in 1 week.  Elevated liver enzymes: Hepatitis and HIV negative.  Could be from sepsis.  Mild elevated.  Trending down.  Right upper quadrant ultrasound without acute findings.  Follow LFTs in the next visit.  Anxiety/depression: On olanzapine and Remeron  Hypophosphatemia: Improved.  Generalized weakness Seen by physical therapy occupational therapy.  Mild cognitive decline.  Supportive care.  Leukocytosis/bandemia: Improving.  Likely secondary to infection.  Latest WBC count of 14.8.  Has completed course of antibiotic.  Will need outpatient follow-up with CBC.  Low appetite likely due to acute illness.   Was seen by dietary. Continue Remeron, multivitamin.  Would benefit from nutritional supplements including Magic cup, Boost Breeze  Disposition.  At this time, patient is stable for disposition to group home.  I have been notified that the group home can take the patient with a Foley catheter.  I  called the patient's sister on the phone and updated her about the plan for disposition.   Medical Consultants:    Urology   Procedures:    Foley catheter  insertion. Subjective:   Today, patient was seen and examined.  Denies any pain, nausea, vomiting, shortness of breath or fever.  Discharge Exam:   Vitals:   05/12/20 0534 05/12/20 0800  BP: 112/69   Pulse:    Resp: 18   Temp: 98.5 F (36.9 C)   SpO2: 95% 96%   Vitals:   05/11/20 2100 05/12/20 0224 05/12/20 0534 05/12/20 0800  BP: 108/71  112/69   Pulse: 95     Resp: 18  18   Temp: (!) 100.8 F (38.2 C) 98.6 F (37 C) 98.5 F (36.9 C)   TempSrc:  Oral Oral   SpO2: 95%  95% 96%  Weight:      Height:         GENERAL: Patient is alert awake and communicative, not in obvious distress. HENT: No scleral pallor or icterus. Pupils equally reactive to light. Oral mucosa is moist NECK: is supple, no gross swelling noted. CHEST: Clear to auscultation. No crackles or wheezes.  Diminished breath sounds bilaterally. CVS: S1 and S2 heard, no murmur. Regular rate and rhythm.  ABDOMEN: Soft, non-tender, bowel sounds are present.  On a Foley catheter EXTREMITIES: No edema. CNS: Cranial nerves are intact. No focal motor deficits. SKIN: warm and dry without rashes.  The results of significant diagnostics from this hospitalization (including imaging, microbiology, ancillary and laboratory) are listed below for reference.     Diagnostic Studies:   DG Chest Port 1 View  Result Date: 05/07/2020 CLINICAL DATA:  Elevated white blood cell count EXAM: PORTABLE CHEST 1 VIEW COMPARISON:  02/27/2020 FINDINGS: Single frontal view of the chest demonstrates an unremarkable cardiac silhouette. No airspace disease, effusion, or pneumothorax. The 8 mm left lower lobe pulmonary nodule seen on recent chest CT is not visible by radiography. No acute bony abnormalities. IMPRESSION: 1. No acute intrathoracic process. 2. Previously identified 8 mm left lower lobe pulmonary nodule not identified by radiograph. Please refer to previous CT report for recommendations regarding follow-up. Electronically Signed    By: Randa Ngo M.D.   On: 05/07/2020 19:52   CT Renal Stone Study  Result Date: 05/07/2020 CLINICAL DATA:  Renal failure.  Acute urinary retention EXAM: CT ABDOMEN AND PELVIS WITHOUT CONTRAST TECHNIQUE: Multidetector CT imaging of the abdomen and pelvis was performed following the standard protocol without IV contrast. COMPARISON:  None. FINDINGS: Lower chest: Bibasilar atelectasis. Hepatobiliary: Scattered hypodensities in the liver difficult to characterize on this noncontrast study, but favor cysts. Gallbladder unremarkable. Pancreas: No focal abnormality or ductal dilatation. Spleen: No focal abnormality.  Normal size. Adrenals/Urinary Tract: Foley catheter present in the bladder which is decompressed. No hydronephrosis. No renal or adrenal mass. Stomach/Bowel: Stomach, large and small bowel grossly unremarkable. Normal appendix. Vascular/Lymphatic: No evidence of aneurysm or adenopathy. Reproductive: Prostate enlargement with a transverse diameter of 6.6 cm. Central calcifications. Other: No free fluid or free air. Musculoskeletal: No acute bony abnormality. IMPRESSION: Decompression a bladder with Foley catheter in place. No hydronephrosis. Scattered hypodensities in the liver, favor cysts, but cannot be characterized on this noncontrast study. Bibasilar atelectasis. Prostate enlargement. Electronically Signed   By: Rolm Baptise M.D.   On: 05/07/2020 20:40     Labs:   Basic Metabolic Panel: Recent Labs  Lab 05/08/20 3734 05/08/20 2876 05/09/20 0420 05/09/20 0420 05/10/20 8115  05/10/20 0528 05/10/20 1052 05/11/20 0517 05/12/20 0421  NA 139  --  140  --  140  --   --  138 136  K 3.7   < > 3.9   < > 4.0   < >  --  4.0 4.2  CL 109  --  105  --  105  --   --  106 107  CO2 22  --  24  --  26  --   --  23 22  GLUCOSE 89  --  105*  --  104*  --   --  98 100*  BUN 41*  --  15  --  17  --   --  15 17  CREATININE 1.13  --  0.85  --  0.85  --   --  0.67 0.73  CALCIUM 7.7*  --  7.3*  --   7.4*  --   --  7.5* 7.4*  MG  --   --  2.2  --  2.0  --   --  2.0 2.0  PHOS  --   --  1.4*  --   --   --  1.9* 2.1* 2.5   < > = values in this interval not displayed.   GFR Estimated Creatinine Clearance: 94.5 mL/min (by C-G formula based on SCr of 0.73 mg/dL). Liver Function Tests: Recent Labs  Lab 05/07/20 1841 05/09/20 0420 05/10/20 0528 05/11/20 0517 05/12/20 0421  AST 42* 54* 76* 85* 61*  ALT 52* 55* 96* 121* 107*  ALKPHOS 111 81 102 105 105  BILITOT 0.9 0.8 0.6 0.8 0.4  PROT 6.7 5.1* 5.1* 5.1* 4.9*  ALBUMIN 3.1* 2.2* 2.1* 2.0* 1.9*   No results for input(s): LIPASE, AMYLASE in the last 168 hours. No results for input(s): AMMONIA in the last 168 hours. Coagulation profile Recent Labs  Lab 05/07/20 2003  INR 1.2    CBC: Recent Labs  Lab 05/07/20 1841 05/07/20 1841 05/08/20 0605 05/09/20 0420 05/10/20 0528 05/11/20 0517 05/12/20 0421  WBC 18.3*   < > 11.1* 13.4* 17.1* 14.6* 14.8*  NEUTROABS 14.6*  --   --   --   --  9.7* 9.8*  HGB 15.1   < > 13.8 12.8* 13.0 12.8* 12.7*  HCT 46.9   < > 40.5 39.3 40.3 39.7 39.2  MCV 95.9   < > 93.1 95.2 97.6 96.4 95.6  PLT 145*   < > 96* 123* 155 195 213   < > = values in this interval not displayed.   Cardiac Enzymes: No results for input(s): CKTOTAL, CKMB, CKMBINDEX, TROPONINI in the last 168 hours. BNP: Invalid input(s): POCBNP CBG: No results for input(s): GLUCAP in the last 168 hours. D-Dimer No results for input(s): DDIMER in the last 72 hours. Hgb A1c No results for input(s): HGBA1C in the last 72 hours. Lipid Profile No results for input(s): CHOL, HDL, LDLCALC, TRIG, CHOLHDL, LDLDIRECT in the last 72 hours. Thyroid function studies No results for input(s): TSH, T4TOTAL, T3FREE, THYROIDAB in the last 72 hours.  Invalid input(s): FREET3 Anemia work up No results for input(s): VITAMINB12, FOLATE, FERRITIN, TIBC, IRON, RETICCTPCT in the last 72 hours. Microbiology Recent Results (from the past 240 hour(s))    Blood Cultures (routine x 2)     Status: None   Collection Time: 05/07/20  7:45 PM   Specimen: BLOOD RIGHT FOREARM  Result Value Ref Range Status   Specimen Description   Final    BLOOD  RIGHT FOREARM Performed at [facility], [place] [address] [place], [place] [address]    Special Requests   Final    BOTTLES DRAWN AEROBIC AND ANAEROBIC Blood Culture results may not be optimal due to an inadequate volume of blood received in culture bottles Performed at [facility] [address], [place], [place] [address]    Culture   Final    NO GROWTH 5 DAYS Performed at [facility], [place] [address], [place], [place] [address]    Report Status 05/12/2020 FINAL  Final  Resp Panel by RT PCR (RSV, Flu A&B, Covid) - Nasopharyngeal Swab     Status: None   Collection Time: 05/07/20  7:45 PM   Specimen: Nasopharyngeal Swab  Result Value Ref Range Status   SARS Coronavirus 2 by RT PCR NEGATIVE NEGATIVE Final    Comment: (NOTE) SARS-CoV-2 target nucleic acids are NOT DETECTED.  The SARS-CoV-2 RNA is generally detectable in upper respiratoy specimens during the acute phase of infection. The lowest concentration of SARS-CoV-2 viral copies this assay can detect is 131 copies/mL. A negative result does not preclude SARS-Cov-2 infection and should not be used as the sole basis for treatment or other patient management decisions. A negative result may occur with  improper specimen collection/handling, submission of specimen other than nasopharyngeal swab, presence of viral mutation(s) within the areas targeted by this assay, and inadequate number of viral copies (<131 copies/mL). A negative result must be combined with clinical observations, patient history, and epidemiological information. The expected result is Negative.  Fact Sheet for Patients:  PinkCheek.be  Fact Sheet for Healthcare Providers:   GravelBags.it  This test is no t yet approved or cleared by the [place] FDA and  has been authorized for detection and/or diagnosis of SARS-CoV-2 by FDA under an Emergency Use Authorization (EUA). This EUA will remain  in effect (meaning this test can be used) for the duration of the COVID-19 declaration under Section 564(b)(1) of the Act, 21 U.S.C. section 360bbb-3(b)(1), unless the authorization is terminated or revoked sooner.     Influenza A by PCR NEGATIVE NEGATIVE Final   Influenza B by PCR NEGATIVE NEGATIVE Final    Comment: (NOTE) The Xpert Xpress SARS-CoV-2/FLU/RSV assay is intended as an aid in  the diagnosis of influenza from Nasopharyngeal swab specimens and  should not be used as a sole basis for treatment. Nasal washings and  aspirates are unacceptable for Xpert Xpress SARS-CoV-2/FLU/RSV  testing.  Fact Sheet for Patients: PinkCheek.be  Fact Sheet for Healthcare Providers: GravelBags.it  This test is not yet approved or cleared by the [place] FDA and  has been authorized for detection and/or diagnosis of SARS-CoV-2 by  FDA under an Emergency Use Authorization (EUA). This EUA will remain  in effect (meaning this test can be used) for the duration of the  Covid-19 declaration under Section 564(b)(1) of the Act, 21  U.S.C. section 360bbb-3(b)(1), unless the authorization is  terminated or revoked.    Respiratory Syncytial Virus by PCR NEGATIVE NEGATIVE Final    Comment: (NOTE) Fact Sheet for Patients: PinkCheek.be  Fact Sheet for Healthcare Providers: GravelBags.it  This test is not yet approved or cleared by the [place] FDA and  has been authorized for detection and/or diagnosis of SARS-CoV-2 by  FDA under an Emergency Use Authorization (EUA). This EUA will remain  in effect (meaning this test can be  used) for the duration of the  COVID-19 declaration under Section  564(b)(1) of the Act, 21 U.S.C.  section 360bbb-3(b)(1), unless the authorization is terminated or  revoked. Performed at [facility], [place] [address] [place], [place] [address]   Blood Cultures (routine x 2)     Status: None   Collection Time: 05/07/20  8:01 PM   Specimen: BLOOD  Result Value Ref Range Status   Specimen Description   Final    BLOOD LEFT ANTECUBITAL Performed at [facility] [address], [place], [place] [address]    Special Requests   Final    BOTTLES DRAWN AEROBIC AND ANAEROBIC Blood Culture results may not be optimal due to an excessive volume of blood received in culture bottles Performed at [facility] [address] [place], [place] [address]    Culture   Final    NO GROWTH 5 DAYS Performed at [facility], [place] [address] [place], [place] [address]    Report Status 05/12/2020 FINAL  Final  Culture, Urine     Status: Abnormal   Collection Time: 05/10/20 11:35 AM   Specimen: Urine, Random  Result Value Ref Range Status   Specimen Description   Final    URINE, RANDOM Performed at [facility] [address] [place], [place] [address]    Special Requests   Final    NONE Performed at [facility], [place] [address] [place], [place] [address]    Culture (A)  Final    <10,000 COLONIES/mL INSIGNIFICANT GROWTH Performed at [facility] [address], [place], [place] [address]    Report Status 05/11/2020 FINAL  Final     Discharge Instructions:   Discharge Instructions    Call MD for:  persistant nausea and vomiting   Complete by: As directed    Call MD for:  severe uncontrolled pain   Complete by: As directed    Call MD for:  temperature >100.4   Complete by: As directed    Diet general   Complete by: As directed    Discharge instructions   Complete by: As directed     Follow-up with your primary care physician in 1-2 weeks.  Follow-up with urology as outpatient in 1-2 weekswith Dr. Junious Silk. Please try to remove Foley catheter in 1 week (voiding trial)   Increase activity slowly   Complete by: As directed      Allergies as of 05/12/2020   No Known Allergies     Medication List    STOP taking these medications   cefdinir 300 MG capsule Commonly known as: OMNICEF   Myrbetriq 50 MG Tb24 tablet Generic drug: mirabegron ER     TAKE these medications   acetaminophen 500 MG tablet Commonly known as: TYLENOL Take 500 mg by mouth every 6 (six) hours as needed for moderate pain.   augmented betamethasone dipropionate 0.05 % cream Commonly known as: DIPROLENE-AF Apply 1 application topically 2 (two) times daily as needed (dry skin).   fluticasone 50 MCG/ACT nasal spray Commonly known as: FLONASE Place 1 spray into both nostrils daily.   ketoconazole 2 % cream Commonly known as: NIZORAL Apply 1 application topically at bedtime.   ketoconazole 2 % shampoo Commonly known as: NIZORAL Apply 1 application topically 3 (three) times a week. On MWF   metroNIDAZOLE 0.75 % cream Commonly known as: METROCREAM Apply 1 application topically at bedtime.   mirtazapine 15 MG tablet Commonly known as: REMERON Take 15 mg by mouth at bedtime.  OLANZapine 2.5 MG tablet Commonly known as: ZYPREXA Take 2.5 mg by mouth at bedtime.   polyethylene glycol 17 g packet Commonly known as: MIRALAX / GLYCOLAX Take 17 g by mouth 2 (two) times daily as needed for mild constipation.   tamsulosin 0.4 MG Caps capsule Commonly known as: FLOMAX Take 0.4 mg by mouth daily.   Toviaz 4 MG Tb24 tablet Generic drug: fesoterodine Take 4 mg by mouth daily.       Follow-up Information    Festus Aloe, MD. Schedule an appointment as soon as possible for a visit in 1 week(s).   Specialty: Urology Why: foley catheter removal and further assemement Contact  information: [address] [address] [phone]        Crist Infante, MD Follow up.   Specialty: Internal Medicine Why: regular followup Contact information: [address] [place]  [address] [phone]                Time coordinating discharge: 39 minutes  Signed:  Reona Zendejas  [facility] 05/12/2020, 12:57 PM

## 2020-05-12 NOTE — NC FL2 (Signed)
[place] MEDICAID FL2 LEVEL OF CARE SCREENING TOOL     IDENTIFICATION  Patient Name: Paul Moreno Birthdate: 26-Feb-1956 Sex: male Admission Date (Current Location): 05/07/2020  [facility] and [place] Number:  Producer, television/film/video and Address:  [facility],  [address], [place] [address]      Provider Number: [id number]  Attending Physician Name and Address:  Joycelyn Das, MD  Relative Name and Phone Number:  Cecelia Byars [phone]  [phone]    Current Level of Care: Hospital Recommended Level of Care: Other (Comment) (group home) Prior Approval Number:    Date Approved/Denied:   PASRR Number: Pending  Discharge Plan: Other (Comment) (group home)    Current Diagnoses: Patient Active Problem List   Diagnosis Date Noted  . Sepsis secondary to UTI (HCC) 05/08/2020  . Acute urinary retention 05/08/2020    Orientation RESPIRATION BLADDER Height & Weight     Self, Time, Situation, Place  Normal Incontinent, Indwelling catheter Weight: 72.6 kg Height:  5\' 10"  (177.8 cm)  BEHAVIORAL SYMPTOMS/MOOD NEUROLOGICAL BOWEL NUTRITION STATUS      Continent Diet  AMBULATORY STATUS COMMUNICATION OF NEEDS Skin   Limited Assist Verbally Normal                       Personal Care Assistance Level of Assistance  Bathing, Feeding, Dressing Bathing Assistance: Limited assistance Feeding assistance: Limited assistance Dressing Assistance: Limited assistance     Functional Limitations Info  Sight, Speech, Hearing Sight Info: Adequate Hearing Info: Adequate Speech Info: Adequate    SPECIAL CARE FACTORS FREQUENCY  PT (By licensed PT), OT (By licensed OT)     PT Frequency: Minimum 5x a week OT Frequency: Minimum 5x a week            Contractures Contractures Info: Not present    Additional Factors Info  Code Status, Allergies, Psychotropic Code Status Info: Full Code Allergies Info: NKA Psychotropic Info: mirtazapine (remeron) tablet 15  mg and Olanzapine (zyprexa) tablet 2.5mg          Current Medications (05/12/2020):  This is the current hospital active medication list Current Facility-Administered Medications  Medication Dose Route Frequency Provider Last Rate Last Admin  . 0.9 %  sodium chloride infusion   Intravenous Continuous 13/02/2020 Z, DO 75 mL/hr at 05/12/20 0531 New Bag at 05/12/20 0531  . acetaminophen (TYLENOL) tablet 650 mg  650 mg Oral Q6H PRN 13/08/21 Z, DO       Or  . acetaminophen (TYLENOL) suppository 650 mg  650 mg Rectal Q6H PRN Renda Rolls Z, DO      . Chlorhexidine Gluconate Cloth 2 % PADS 6 each  6 each Topical Daily Pokhrel, Laxman, MD   6 each at 05/11/20 1000  . feeding supplement (BOOST / RESOURCE BREEZE) liquid 1 Container  1 Container Oral BID BM 13/07/21, MD   1 Container at 05/12/20 1020  . fluticasone (FLONASE) 50 MCG/ACT nasal spray 1 spray  1 spray Each Nare Daily 13/08/21 Z, DO   1 spray at 05/12/20 1019  . heparin injection 5,000 Units  5,000 Units Subcutaneous Q8H 13/08/21 Z, DO   5,000 Units at 05/12/20 13/08/21  . mirtazapine (REMERON) tablet 15 mg  15 mg Oral QHS 2706 Z, DO   15 mg at 05/11/20 2059  . multivitamin with minerals tablet 1 tablet  1 tablet Oral Daily 2060, MD   1 tablet at 05/12/20 1019  .  OLANZapine (ZYPREXA) tablet 2.5 mg  2.5 mg Oral QHS Renda Rolls Z, DO   2.5 mg at 05/11/20 2100  . ondansetron (ZOFRAN) tablet 4 mg  4 mg Oral Q6H PRN Renda Rolls Z, DO       Or  . ondansetron [facility]) injection 4 mg  4 mg Intravenous Q6H PRN Renda Rolls Z, DO      . polyethylene glycol (MIRALAX / GLYCOLAX) packet 17 g  17 g Oral BID PRN Almon Hercules, MD   17 g at 05/10/20 1126  . senna-docusate (Senokot-S) tablet 1 tablet  1 tablet Oral BID PRN Almon Hercules, MD   1 tablet at 05/10/20 1126  . tamsulosin (FLOMAX) capsule 0.4 mg  0.4 mg Oral Daily Renda Rolls Z, DO   0.4 mg at 05/12/20 1019     Discharge Medications: Please  see discharge summary for a list of discharge medications.  Relevant Imaging Results:  Relevant Lab Results:   Additional Information SSN [id number]  Armanda Heritage, RN

## 2020-05-14 DIAGNOSIS — N179 Acute kidney failure, unspecified: Secondary | ICD-10-CM | POA: Diagnosis not present

## 2020-05-14 DIAGNOSIS — Z9181 History of falling: Secondary | ICD-10-CM | POA: Diagnosis not present

## 2020-05-14 DIAGNOSIS — F419 Anxiety disorder, unspecified: Secondary | ICD-10-CM | POA: Diagnosis not present

## 2020-05-14 DIAGNOSIS — N390 Urinary tract infection, site not specified: Secondary | ICD-10-CM | POA: Diagnosis not present

## 2020-05-14 DIAGNOSIS — F32A Depression, unspecified: Secondary | ICD-10-CM | POA: Diagnosis not present

## 2020-05-14 DIAGNOSIS — R6521 Severe sepsis with septic shock: Secondary | ICD-10-CM | POA: Diagnosis not present

## 2020-05-14 DIAGNOSIS — A419 Sepsis, unspecified organism: Secondary | ICD-10-CM | POA: Diagnosis not present

## 2020-05-14 DIAGNOSIS — R338 Other retention of urine: Secondary | ICD-10-CM | POA: Diagnosis not present

## 2020-05-14 DIAGNOSIS — Z79899 Other long term (current) drug therapy: Secondary | ICD-10-CM | POA: Diagnosis not present

## 2020-05-14 DIAGNOSIS — N401 Enlarged prostate with lower urinary tract symptoms: Secondary | ICD-10-CM | POA: Diagnosis not present

## 2020-05-14 DIAGNOSIS — F70 Mild intellectual disabilities: Secondary | ICD-10-CM | POA: Diagnosis not present

## 2020-05-19 DIAGNOSIS — R32 Unspecified urinary incontinence: Secondary | ICD-10-CM | POA: Diagnosis not present

## 2020-05-19 DIAGNOSIS — N3281 Overactive bladder: Secondary | ICD-10-CM | POA: Diagnosis not present

## 2020-05-19 DIAGNOSIS — R5383 Other fatigue: Secondary | ICD-10-CM | POA: Diagnosis not present

## 2020-05-19 DIAGNOSIS — A419 Sepsis, unspecified organism: Secondary | ICD-10-CM | POA: Diagnosis not present

## 2020-05-19 DIAGNOSIS — R339 Retention of urine, unspecified: Secondary | ICD-10-CM | POA: Diagnosis not present

## 2020-05-20 DIAGNOSIS — R350 Frequency of micturition: Secondary | ICD-10-CM | POA: Diagnosis not present

## 2020-05-20 DIAGNOSIS — R3914 Feeling of incomplete bladder emptying: Secondary | ICD-10-CM | POA: Diagnosis not present

## 2020-05-20 DIAGNOSIS — R351 Nocturia: Secondary | ICD-10-CM | POA: Diagnosis not present

## 2020-05-27 ENCOUNTER — Other Ambulatory Visit: Payer: Self-pay

## 2020-05-27 ENCOUNTER — Inpatient Hospital Stay (HOSPITAL_COMMUNITY)
Admission: EM | Admit: 2020-05-27 | Discharge: 2020-05-30 | DRG: 872 | Disposition: A | Discharge status: Home / Self Care | Payer: Medicare Other | Attending: Internal Medicine | Admitting: Internal Medicine

## 2020-05-27 ENCOUNTER — Encounter (HOSPITAL_COMMUNITY): Payer: Self-pay | Admitting: *Deleted

## 2020-05-27 ENCOUNTER — Emergency Department (HOSPITAL_COMMUNITY): Payer: Medicare Other

## 2020-05-27 DIAGNOSIS — N10 Acute tubulo-interstitial nephritis: Secondary | ICD-10-CM | POA: Diagnosis not present

## 2020-05-27 DIAGNOSIS — N12 Tubulo-interstitial nephritis, not specified as acute or chronic: Secondary | ICD-10-CM

## 2020-05-27 DIAGNOSIS — F419 Anxiety disorder, unspecified: Secondary | ICD-10-CM | POA: Diagnosis present

## 2020-05-27 DIAGNOSIS — F32A Depression, unspecified: Secondary | ICD-10-CM | POA: Diagnosis not present

## 2020-05-27 DIAGNOSIS — Z79899 Other long term (current) drug therapy: Secondary | ICD-10-CM

## 2020-05-27 DIAGNOSIS — R339 Retention of urine, unspecified: Secondary | ICD-10-CM | POA: Diagnosis not present

## 2020-05-27 DIAGNOSIS — A4152 Sepsis due to Pseudomonas: Secondary | ICD-10-CM | POA: Diagnosis not present

## 2020-05-27 DIAGNOSIS — F79 Unspecified intellectual disabilities: Secondary | ICD-10-CM | POA: Diagnosis not present

## 2020-05-27 DIAGNOSIS — N390 Urinary tract infection, site not specified: Secondary | ICD-10-CM | POA: Diagnosis present

## 2020-05-27 DIAGNOSIS — I959 Hypotension, unspecified: Secondary | ICD-10-CM | POA: Diagnosis present

## 2020-05-27 DIAGNOSIS — A419 Sepsis, unspecified organism: Secondary | ICD-10-CM | POA: Diagnosis not present

## 2020-05-27 DIAGNOSIS — N3000 Acute cystitis without hematuria: Secondary | ICD-10-CM | POA: Diagnosis present

## 2020-05-27 DIAGNOSIS — Z20822 Contact with and (suspected) exposure to covid-19: Secondary | ICD-10-CM | POA: Diagnosis present

## 2020-05-27 LAB — CBC WITH DIFFERENTIAL/PLATELET
Abs Immature Granulocytes: 0.05 10*3/uL (ref 0.00–0.07)
Basophils Absolute: 0 10*3/uL (ref 0.0–0.1)
Basophils Relative: 0 %
Eosinophils Absolute: 0.2 10*3/uL (ref 0.0–0.5)
Eosinophils Relative: 2 %
HCT: 41.4 % (ref 39.0–52.0)
Hemoglobin: 13.3 g/dL (ref 13.0–17.0)
Immature Granulocytes: 1 %
Lymphocytes Relative: 9 %
Lymphs Abs: 0.9 10*3/uL (ref 0.7–4.0)
MCH: 31.4 pg (ref 26.0–34.0)
MCHC: 32.1 g/dL (ref 30.0–36.0)
MCV: 97.6 fL (ref 80.0–100.0)
Monocytes Absolute: 0.7 10*3/uL (ref 0.1–1.0)
Monocytes Relative: 6 %
Neutro Abs: 8.7 10*3/uL — ABNORMAL HIGH (ref 1.7–7.7)
Neutrophils Relative %: 82 %
Platelets: 219 10*3/uL (ref 150–400)
RBC: 4.24 MIL/uL (ref 4.22–5.81)
RDW: 13.2 % (ref 11.5–15.5)
WBC: 10.5 10*3/uL (ref 4.0–10.5)
nRBC: 0 % (ref 0.0–0.2)

## 2020-05-27 LAB — URINALYSIS, ROUTINE W REFLEX MICROSCOPIC
Bilirubin Urine: NEGATIVE
Glucose, UA: NEGATIVE mg/dL
Hgb urine dipstick: NEGATIVE
Ketones, ur: NEGATIVE mg/dL
Nitrite: NEGATIVE
Protein, ur: NEGATIVE mg/dL
Specific Gravity, Urine: 1.006 (ref 1.005–1.030)
pH: 6 (ref 5.0–8.0)

## 2020-05-27 LAB — COMPREHENSIVE METABOLIC PANEL
ALT: 35 U/L (ref 0–44)
AST: 30 U/L (ref 15–41)
Albumin: 2.9 g/dL — ABNORMAL LOW (ref 3.5–5.0)
Alkaline Phosphatase: 98 U/L (ref 38–126)
Anion gap: 9 (ref 5–15)
BUN: 14 mg/dL (ref 8–23)
CO2: 24 mmol/L (ref 22–32)
Calcium: 8.5 mg/dL — ABNORMAL LOW (ref 8.9–10.3)
Chloride: 105 mmol/L (ref 98–111)
Creatinine, Ser: 0.7 mg/dL (ref 0.61–1.24)
GFR, Estimated: >60 mL/min (ref >60)
Glucose, Bld: 145 mg/dL — ABNORMAL HIGH (ref 70–99)
Potassium: 3.5 mmol/L (ref 3.5–5.1)
Sodium: 138 mmol/L (ref 135–145)
Total Bilirubin: 0.7 mg/dL (ref 0.3–1.2)
Total Protein: 6.4 g/dL — ABNORMAL LOW (ref 6.5–8.1)

## 2020-05-27 LAB — RESP PANEL BY RT-PCR (FLU A&B, COVID) ARPGX2
Influenza A by PCR: NEGATIVE
Influenza B by PCR: NEGATIVE
SARS Coronavirus 2 by RT PCR: NEGATIVE

## 2020-05-27 LAB — LACTIC ACID, PLASMA
Lactic Acid, Venous: 2.3 mmol/L (ref 0.5–1.9)
Lactic Acid, Venous: 1.4 mmol/L (ref 0.5–1.9)

## 2020-05-27 LAB — PROTIME-INR
INR: 1.1 (ref 0.8–1.2)
Prothrombin Time: 13.7 seconds (ref 11.4–15.2)

## 2020-05-27 LAB — APTT: aPTT: 29 seconds (ref 24–36)

## 2020-05-27 MED ORDER — ACETAMINOPHEN 650 MG RE SUPP: 650.0000 mg | Freq: Four times a day (QID) | RECTAL | Status: DC | PRN

## 2020-05-27 MED ORDER — SODIUM CHLORIDE 0.9 % IV BOLUS
1000.0000 mL | Freq: Once | INTRAVENOUS | Status: AC
  Administered 2020-05-27: 1000 mL via INTRAVENOUS

## 2020-05-27 MED ORDER — ENOXAPARIN SODIUM 40 MG/0.4ML ~~LOC~~ SOLN
40.0000 mg | SUBCUTANEOUS | Status: DC
  Administered 2020-05-27 – 2020-05-29 (×3): 40 mg via SUBCUTANEOUS
  Filled 2020-05-27 (×4): qty 0.4

## 2020-05-27 MED ORDER — ONDANSETRON HCL 4 MG/2ML IJ SOLN: 4.0000 mg | Freq: Four times a day (QID) | INTRAMUSCULAR | Status: DC | PRN

## 2020-05-27 MED ORDER — RISAQUAD PO CAPS
1.0000 | ORAL_CAPSULE | Freq: Every day | ORAL | Status: DC
  Administered 2020-05-27 – 2020-05-30 (×4): 1 via ORAL
  Filled 2020-05-27 (×5): qty 1

## 2020-05-27 MED ORDER — POLYETHYLENE GLYCOL 3350 17 G PO PACK: 17.0000 g | PACK | Freq: Every day | ORAL | Status: DC | PRN

## 2020-05-27 MED ORDER — SODIUM CHLORIDE 0.9 % IV SOLN
1.0000 g | Freq: Once | INTRAVENOUS | Status: AC
  Administered 2020-05-27: 1 g via INTRAVENOUS
  Filled 2020-05-27: qty 10

## 2020-05-27 MED ORDER — CHLORHEXIDINE GLUCONATE CLOTH 2 % EX PADS
6.0000 | MEDICATED_PAD | Freq: Every day | CUTANEOUS | Status: DC
  Administered 2020-05-27 – 2020-05-30 (×4): 6 via TOPICAL

## 2020-05-27 MED ORDER — SODIUM CHLORIDE 0.9 % IV SOLN: INTRAVENOUS | Status: DC

## 2020-05-27 MED ORDER — FLUTICASONE PROPIONATE 50 MCG/ACT NA SUSP
1.0000 | Freq: Every day | NASAL | Status: DC
  Administered 2020-05-27 – 2020-05-30 (×4): 1 via NASAL
  Filled 2020-05-27: qty 16

## 2020-05-27 MED ORDER — OLANZAPINE 5 MG PO TABS
2.5000 mg | ORAL_TABLET | Freq: Every day | ORAL | Status: DC
  Administered 2020-05-27 – 2020-05-29 (×3): 2.5 mg via ORAL
  Filled 2020-05-27 (×3): qty 1

## 2020-05-27 MED ORDER — DOCUSATE SODIUM 100 MG PO CAPS
100.0000 mg | ORAL_CAPSULE | Freq: Two times a day (BID) | ORAL | Status: DC
  Administered 2020-05-27 – 2020-05-30 (×6): 100 mg via ORAL
  Filled 2020-05-27 (×6): qty 1

## 2020-05-27 MED ORDER — FESOTERODINE FUMARATE ER 4 MG PO TB24
4.0000 mg | ORAL_TABLET | Freq: Every day | ORAL | Status: DC
  Administered 2020-05-27 – 2020-05-30 (×4): 4 mg via ORAL
  Filled 2020-05-27 (×5): qty 1

## 2020-05-27 MED ORDER — MIRTAZAPINE 15 MG PO TABS
15.0000 mg | ORAL_TABLET | Freq: Every day | ORAL | Status: DC
  Administered 2020-05-27 – 2020-05-29 (×3): 15 mg via ORAL
  Filled 2020-05-27 (×3): qty 1

## 2020-05-27 MED ORDER — ACETAMINOPHEN 325 MG PO TABS: 650.0000 mg | ORAL_TABLET | Freq: Four times a day (QID) | ORAL | Status: DC | PRN

## 2020-05-27 MED ORDER — SODIUM CHLORIDE 0.9 % IV SOLN
2.0000 g | Freq: Three times a day (TID) | INTRAVENOUS | Status: DC
  Administered 2020-05-27 – 2020-05-30 (×10): 2 g via INTRAVENOUS
  Filled 2020-05-27 (×12): qty 2

## 2020-05-27 MED ORDER — ONDANSETRON HCL 4 MG PO TABS: 4.0000 mg | ORAL_TABLET | Freq: Four times a day (QID) | ORAL | Status: DC | PRN

## 2020-05-27 NOTE — H&P (Signed)
History and Physical    Paul Moreno GEX:[id number] DOB: 1955-07-30 DOA: 05/27/2020  PCP: [name], MD  Patient coming from: [facility]  Chief Complaint: dizziness, weakness  HPI: Paul Moreno is a 64 y.o. male with medical history significant of intellectual disability, urinary retention on chronic foley. Presenting with weakness, dizziness, hypotension. Recent admission for sepsis secondary to UTI. Went home with foley d/t urinary retention. Saw urology last week (Wed) for a voiding trial; however, he failed that voiding trial. They changed his catheter and sent off cultures at that time. His cultures were positive for PSA. He was started on cipro Friday. Over the weekend, he suffered lightheadedness, dizziness. He didn't try any medicines to help. He just tried rest. He went to [facility] today for a follow up voiding trial. In clinic he was hypotensive and tachycardic. The staff was concerned for sepsis, so he was sent to the ED.   ED Course: He was given fluids and started on rocephin. TRH was called for admission.   Review of Systems:  Denies CP, N, V, F, ab pain, D. Review of systems is otherwise negative for all not mentioned in HPI.   PMHx Past Medical History:  Diagnosis Date  . Mental disorder     PSHx History reviewed. No pertinent surgical history.  SocHx  reports that he has never smoked. He has never used smokeless tobacco. He reports that he does not drink alcohol and does not use drugs.  No Known Allergies  FamHx Family History  Problem Relation Age of Onset  . Diabetes Father   . Heart failure Father     Prior to Admission medications   Medication Sig Start Date End Date Taking? Authorizing Provider  acetaminophen (TYLENOL) 500 MG tablet Take 500 mg by mouth every 6 (six) hours as needed for moderate pain.   Yes [provider]  augmented betamethasone dipropionate (DIPROLENE-AF) 0.05 % cream Apply 1 application topically 2 (two) times daily as  needed (dry skin).  04/04/20  Yes [provider]  cetaphil (CETAPHIL) lotion Apply 1 application topically as needed for dry skin.   Yes [provider]  ciprofloxacin (CIPRO) 250 MG tablet Take 250 mg by mouth 2 (two) times daily. 7 day supply 05/23/20  Yes [provider]  Eyelid Cleansers (OCUSOFT LID SCRUB EX) Apply 1 application topically in the morning and at bedtime. Apply ocusoft lid scrub pad twice a day after warm compresses   Yes [provider]  fluticasone (FLONASE) 50 MCG/ACT nasal spray Place 1 spray into both nostrils daily.  04/12/20  Yes [provider]  ketoconazole (NIZORAL) 2 % cream Apply 1 application topically at bedtime.  05/07/20  Yes [provider]  ketoconazole (NIZORAL) 2 % shampoo Apply 1 application topically 3 (three) times a week. On MWF 05/07/20  Yes [provider]  metroNIDAZOLE (METROCREAM) 0.75 % cream Apply 1 application topically daily.    Yes [provider]  mirtazapine (REMERON) 15 MG tablet Take 15 mg by mouth at bedtime. 05/01/20  Yes [provider]  OLANZapine (ZYPREXA) 2.5 MG tablet Take 2.5 mg by mouth at bedtime. 05/01/20  Yes [provider]  polyethylene glycol (MIRALAX / GLYCOLAX) 17 g packet Take 17 g by mouth 2 (two) times daily as needed for mild constipation. 05/12/20  Yes Pokhrel, Laxman, MD  Probiotic Product (ALIGN) 4 MG CAPS Take 4 mg by mouth daily.   Yes [provider]  tamsulosin (FLOMAX) 0.4 MG CAPS capsule Take  0.4 mg by mouth daily. 05/01/20  Yes [provider]  TOVIAZ 4 MG TB24 tablet Take 4 mg by mouth daily. 05/01/20  Yes [provider]    Physical Exam: Vitals:   05/27/20 0858 05/27/20 0953 05/27/20 1000  BP: 98/78  103/71  Pulse: (!) 108  99  Resp: 17  12  Temp: 98.5 F (36.9 C) 100.2 F (37.9 C)   TempSrc: Oral Rectal   SpO2: 99%  100%    General: 64 y.o. male resting in bed in NAD Eyes: PERRL,  normal sclera ENMT: Nares patent w/o discharge, orophaynx clear, dentition normal, ears w/o discharge/lesions/ulcers Neck: Supple, trachea midline Cardiovascular: tachy, +S1, S2, no m/g/r, equal pulses throughout Respiratory: CTABL, no w/r/r, normal WOB GI: BS+, NDNT, no masses noted, no organomegaly noted MSK: No e/c/c Skin: No rashes, bruises, ulcerations noted Neuro: A&O x 3, no focal deficits Psyc: Appropriate interaction but flat affect, calm/cooperative  Labs on Admission: I have personally reviewed following labs and imaging studies  CBC: Recent Labs  Lab 05/27/20 0923  WBC 10.5  NEUTROABS 8.7*  HGB 13.3  HCT 41.4  MCV 97.6  PLT 219   Basic Metabolic Panel: Recent Labs  Lab 05/27/20 0923  NA 138  K 3.5  CL 105  CO2 24  GLUCOSE 145*  BUN 14  CREATININE 0.70  CALCIUM 8.5*   GFR: CrCl cannot be calculated (Unknown ideal weight.). Liver Function Tests: Recent Labs  Lab 05/27/20 0923  AST 30  ALT 35  ALKPHOS 98  BILITOT 0.7  PROT 6.4*  ALBUMIN 2.9*   No results for input(s): LIPASE, AMYLASE in the last 168 hours. No results for input(s): AMMONIA in the last 168 hours. Coagulation Profile: Recent Labs  Lab 05/27/20 0923  INR 1.1   Cardiac Enzymes: No results for input(s): CKTOTAL, CKMB, CKMBINDEX, TROPONINI in the last 168 hours. BNP (last 3 results) No results for input(s): PROBNP in the last 8760 hours. HbA1C: No results for input(s): HGBA1C in the last 72 hours. CBG: No results for input(s): GLUCAP in the last 168 hours. Lipid Profile: No results for input(s): CHOL, HDL, LDLCALC, TRIG, CHOLHDL, LDLDIRECT in the last 72 hours. Thyroid Function Tests: No results for input(s): TSH, T4TOTAL, FREET4, T3FREE, THYROIDAB in the last 72 hours. Anemia Panel: No results for input(s): VITAMINB12, FOLATE, FERRITIN, TIBC, IRON, RETICCTPCT in the last 72 hours. Urine analysis:    Component Value Date/Time   COLORURINE YELLOW 05/07/2020 1945    APPEARANCEUR HAZY (A) 05/07/2020 1945   LABSPEC 1.016 05/07/2020 1945   PHURINE 5.0 05/07/2020 1945   GLUCOSEU NEGATIVE 05/07/2020 1945   HGBUR MODERATE (A) 05/07/2020 1945   BILIRUBINUR NEGATIVE 05/07/2020 1945   KETONESUR NEGATIVE 05/07/2020 1945   PROTEINUR NEGATIVE 05/07/2020 1945   NITRITE NEGATIVE 05/07/2020 1945   LEUKOCYTESUR SMALL (A) 05/07/2020 1945    Radiological Exams on Admission: DG Chest Port 1 View  Result Date: 05/27/2020 CLINICAL DATA:  Sepsis EXAM: PORTABLE CHEST 1 VIEW COMPARISON:  05/07/2020 FINDINGS: The heart size and mediastinal contours are stable. No focal airspace consolidation, pleural effusion, or pneumothorax. The visualized skeletal structures are unremarkable. IMPRESSION: No active disease. Electronically Signed   By: Duanne Guess D.O.   On: 05/27/2020 10:20    EKG: Independently reviewed. Sinus tach, no st elevation  Assessment/Plan PSA UTI     - admit to obs, med-surg     - culture data from [facility] shows pansensative pseudomonas     - he was started  on cipro, but failed therapy     - Given rocephin in ED; discontinue and start cefipime; continue fluids     - UCx and Bld Cx sent  Urinary retention w/ now chronic foley     - hypotensive at admission     - will hold his flomax for now     - foley exchanged in ED; follow output  Intellectual disability Depression/Anxiety     - continue home remeron, zyprexa  DVT prophylaxis: lovenox  Code Status: FULL  Family Communication: w/ sister at bedside  Consults called: None  Status is: Observation  The patient remains OBS appropriate and will d/c before 2 midnights.  Dispo: The patient is from: Home              Anticipated d/c is to: Home              Anticipated d/c date is: 1 day              Patient currently is not medically stable to d/c.  Teddy Spike DO [facility]  If 7PM-7AM, please contact night-coverage [email or web address]  05/27/2020, 10:33 AM

## 2020-05-27 NOTE — ED Provider Notes (Signed)
[facility]-EMERGENCY DEPT Provider Note   CSN: [id number] Arrival date & time: 05/27/20  0846     History Chief Complaint  Patient presents with  . Hypotension    Paul Moreno is a 64 y.o. male.  Patient sent here from urology office as he did have low blood pressure and elevated heart rate.  Is currently on Cipro for UTI.  Has Foley catheter in place at this time it was changed last week.  Patient denies any pain.  No fever.  Overall appears well per caregiver.  States that has been eating and drinking well.  Urology was concerned for sepsis.  The history is provided by the patient and a caregiver.  Illness Severity:  Mild Onset quality:  Gradual Progression:  Resolved Chronicity:  New Associated symptoms: no abdominal pain, no chest pain, no congestion, no cough, no diarrhea, no ear pain, no fever, no headaches, no rash, no shortness of breath, no sore throat and no vomiting        Past Medical History:  Diagnosis Date  . Mental disorder     Patient Active Problem List   Diagnosis Date Noted  . UTI (urinary tract infection) 05/27/2020  . Sepsis secondary to UTI (HCC) 05/08/2020  . Acute urinary retention 05/08/2020    History reviewed. No pertinent surgical history.     Family History  Problem Relation Age of Onset  . Diabetes Father   . Heart failure Father     Social History   Tobacco Use  . Smoking status: Never Smoker  . Smokeless tobacco: Never Used  Vaping Use  . Vaping Use: Never used  Substance Use Topics  . Alcohol use: Never  . Drug use: Never    Home Medications Prior to Admission medications   Medication Sig Start Date End Date Taking? Authorizing Provider  acetaminophen (TYLENOL) 500 MG tablet Take 500 mg by mouth every 6 (six) hours as needed for moderate pain.   Yes [provider]  augmented betamethasone dipropionate (DIPROLENE-AF) 0.05 % cream Apply 1 application topically 2 (two) times daily as  needed (dry skin).  04/04/20  Yes [provider]  cetaphil (CETAPHIL) lotion Apply 1 application topically as needed for dry skin.   Yes [provider]  ciprofloxacin (CIPRO) 250 MG tablet Take 250 mg by mouth 2 (two) times daily. 7 day supply 05/23/20  Yes [provider]  Eyelid Cleansers (OCUSOFT LID SCRUB EX) Apply 1 application topically in the morning and at bedtime. Apply ocusoft lid scrub pad twice a day after warm compresses   Yes [provider]  fluticasone (FLONASE) 50 MCG/ACT nasal spray Place 1 spray into both nostrils daily.  04/12/20  Yes [provider]  ketoconazole (NIZORAL) 2 % cream Apply 1 application topically at bedtime.  05/07/20  Yes [provider]  ketoconazole (NIZORAL) 2 % shampoo Apply 1 application topically 3 (three) times a week. On MWF 05/07/20  Yes [provider]  metroNIDAZOLE (METROCREAM) 0.75 % cream Apply 1 application topically daily.    Yes [provider]  mirtazapine (REMERON) 15 MG tablet Take 15 mg by mouth at bedtime. 05/01/20  Yes [provider]  OLANZapine (ZYPREXA) 2.5 MG tablet Take 2.5 mg by mouth at bedtime. 05/01/20  Yes [provider]  polyethylene glycol (MIRALAX / GLYCOLAX) 17 g packet Take 17 g by mouth 2 (two) times daily as needed for mild constipation. 05/12/20  Yes Pokhrel, Rebekah [place], MD  Probiotic Product (ALIGN) 4  MG CAPS Take 4 mg by mouth daily.   Yes [provider]  tamsulosin (FLOMAX) 0.4 MG CAPS capsule Take 0.4 mg by mouth daily. 05/01/20  Yes [provider]  TOVIAZ 4 MG TB24 tablet Take 4 mg by mouth daily. 05/01/20  Yes [provider]    Allergies    Patient has no known allergies.  Review of Systems   Review of Systems  Constitutional: Negative for chills and fever.  HENT: Negative for congestion, ear pain and sore throat.   Eyes: Negative for pain and visual disturbance.  Respiratory: Negative for cough  and shortness of breath.   Cardiovascular: Negative for chest pain and palpitations.  Gastrointestinal: Negative for abdominal pain, diarrhea and vomiting.  Genitourinary: Negative for dysuria and hematuria.  Musculoskeletal: Negative for arthralgias and back pain.  Skin: Negative for color change and rash.  Neurological: Negative for seizures, syncope and headaches.  All other systems reviewed and are negative.   Physical Exam Updated Vital Signs  ED Triage Vitals  Enc Vitals Group     BP 05/27/20 0858 98/78     Pulse Rate 05/27/20 0858 (!) 108     Resp 05/27/20 0858 17     Temp 05/27/20 0858 98.5 F (36.9 C)     Temp Source 05/27/20 0858 Oral     SpO2 05/27/20 0858 99 %     Weight --      Height --      Head Circumference --      Peak Flow --      Pain Score 05/27/20 0902 0     Pain Loc --      Pain Edu? --      Excl. in GC? --     Physical Exam Vitals and nursing note reviewed.  Constitutional:      General: He is not in acute distress.    Appearance: He is well-developed. He is not ill-appearing.  HENT:     Head: Normocephalic and atraumatic.     Nose: Nose normal.     Mouth/Throat:     Mouth: Mucous membranes are moist.  Eyes:     Extraocular Movements: Extraocular movements intact.     Conjunctiva/sclera: Conjunctivae normal.     Pupils: Pupils are equal, round, and reactive to light.  Cardiovascular:     Rate and Rhythm: Regular rhythm. Tachycardia present.     Heart sounds: Normal heart sounds. No murmur heard.   Pulmonary:     Effort: Pulmonary effort is normal. No respiratory distress.     Breath sounds: Normal breath sounds.  Abdominal:     General: There is no distension.     Palpations: Abdomen is soft.     Comments: Foley in place with dark urine   Musculoskeletal:     Cervical back: Normal range of motion and neck supple.  Skin:    General: Skin is warm and dry.     Capillary Refill: Capillary refill takes less than 2 seconds.    Neurological:     General: No focal deficit present.     Mental Status: He is alert.  Psychiatric:        Mood and Affect: Mood normal.     ED Results / Procedures / Treatments   Labs (all labs ordered are listed, but only abnormal results are displayed) Labs Reviewed  LACTIC ACID, PLASMA - Abnormal; Notable for the following components:      Result Value   Lactic Acid, Venous  2.3 (*)    All other components within normal limits  COMPREHENSIVE METABOLIC PANEL - Abnormal; Notable for the following components:   Glucose, Bld 145 (*)    Calcium 8.5 (*)    Total Protein 6.4 (*)    Albumin 2.9 (*)    All other components within normal limits  CBC WITH DIFFERENTIAL/PLATELET - Abnormal; Notable for the following components:   Neutro Abs 8.7 (*)    All other components within normal limits  CULTURE, BLOOD (SINGLE)  URINE CULTURE  CULTURE, BLOOD (SINGLE)  RESP PANEL BY RT-PCR (FLU A&B, COVID) [id number]  PROTIME-INR  APTT  LACTIC ACID, PLASMA  URINALYSIS, ROUTINE W REFLEX MICROSCOPIC    EKG EKG Interpretation  Date/Time:  Tuesday May 27 2020 08:56:28 EST Ventricular Rate:  109 PR Interval:    QRS Duration: 105 QT Interval:  342 QTC Calculation: 461 R Axis:   -57 Text Interpretation: Sinus tachycardia Left anterior fascicular block Abnormal R-wave progression, late transition Left ventricular hypertrophy Confirmed by Virgina NorfolkAdam, Gareth Fitzner [phone]) on 05/27/2020 9:42:28 AM   Radiology DG Chest Port 1 View  Result Date: 05/27/2020 CLINICAL DATA:  Sepsis EXAM: PORTABLE CHEST 1 VIEW COMPARISON:  05/07/2020 FINDINGS: The heart size and mediastinal contours are stable. No focal airspace consolidation, pleural effusion, or pneumothorax. The visualized skeletal structures are unremarkable. IMPRESSION: No active disease. Electronically Signed   By: Duanne GuessNicholas  Plundo D.O.   On: 05/27/2020 10:20    Procedures .Critical Care Performed by: Virgina Norfolkuratolo, Naziah Weckerly, DO Authorized by: Virgina Norfolkuratolo,  Saraiah Bhat, DO   Critical care provider statement:    Critical care time (minutes):  40   Critical care was necessary to treat or prevent imminent or life-threatening deterioration of the following conditions:  Sepsis   Critical care was time spent personally by me on the following activities:  Blood draw for specimens, development of treatment plan with patient or surrogate, discussions with primary provider, evaluation of patient's response to treatment, examination of patient, obtaining history from patient or surrogate, ordering and performing treatments and interventions, ordering and review of laboratory studies, ordering and review of radiographic studies, pulse oximetry, re-evaluation of patient's condition and review of old charts   I assumed direction of critical care for this patient from another provider in my specialty: no     (including critical care time)  Medications Ordered in ED Medications  cefTRIAXone (ROCEPHIN) 1 g in sodium chloride 0.9 % 100 mL IVPB (1 g Intravenous New Bag/Given 05/27/20 1024)  sodium chloride 0.9 % bolus 1,000 mL (1,000 mLs Intravenous New Bag/Given 05/27/20 [id number])    ED Course  I have reviewed the triage vital signs and the nursing notes.  Pertinent labs & imaging results that were available during my care of the patient were reviewed by me and considered in my medical decision making (see chart for details).    MDM Rules/Calculators/A&P                          Illene SilverBarry Staggs is a 64 year old male with intellectual disability who presents the ED with concern for infectious process.  Patient with normal vitals except for mild tachycardia.  Urology office had called me concerned about possible sepsis as he did have low blood pressure and elevated heart rate.  Those have seemed to resolve.  He currently has a Foley catheter that was placed during recent hospital admission for sepsis and UTI.  He is currently on ciprofloxacin for UTI.  Had his Foley  changed last  week after he had a failed trial of void.  Overall patient appears comfortable.  Vital signs are reassuring upon arrival with blood pressure of 98/78.  No fever.  Heart rate 105.  Denies any cough or shortness of breath.  Foley catheter is in place and overall well-appearing.  Urine in the Foley bag is dark and suspect may be some dehydration.  Will pursue infectious work-up including one blood culture and lactic acid while given a fluid bolus.  White count of 10.5 but mild lactic acidosis at 2.3.  Patient did have a low-grade temperature 100.2 rectally.  Sepsis protocol was fully initiated with an additional blood culture and IV antibiotics.  He did have a pansensitive urine culture several days ago we will start IV Rocephin.  Has been on ciprofloxacin for the last couple days.  Chest x-ray shows no obvious infection.  No significant anemia or electrolyte abnormality otherwise.  Overall given that he has had a Foley catheter in for a while he is high risk for bacteremia and will admit for further IV antibiotics and hydration.  Admitted to medicine in stable condition.  This chart was dictated using voice recognition software.  Despite best efforts to proofread,  errors can occur which can change the documentation meaning.   Final Clinical Impression(s) / ED Diagnoses Final diagnoses:  Sepsis, due to unspecified organism, unspecified whether acute organ dysfunction present [facility])  Acute cystitis without hematuria    Rx / DC Orders ED Discharge Orders    None       Virgina Norfolk, DO 05/27/20 1026

## 2020-05-27 NOTE — Progress Notes (Signed)
Notified bedside nurse of need to draw repeat lactic acid. 

## 2020-05-27 NOTE — ED Triage Notes (Signed)
Per [facility] NP, the patient was being seen in clinic and had BP of 82/54, HR 137. He was hospitalized for urosepsis Nov 3rd, discharged with foley. He is taking cipro.

## 2020-05-28 ENCOUNTER — Observation Stay (HOSPITAL_COMMUNITY): Payer: Medicare Other

## 2020-05-28 DIAGNOSIS — A4152 Sepsis due to Pseudomonas: Secondary | ICD-10-CM | POA: Diagnosis present

## 2020-05-28 DIAGNOSIS — A419 Sepsis, unspecified organism: Secondary | ICD-10-CM | POA: Diagnosis not present

## 2020-05-28 DIAGNOSIS — N390 Urinary tract infection, site not specified: Secondary | ICD-10-CM | POA: Diagnosis present

## 2020-05-28 DIAGNOSIS — F32A Depression, unspecified: Secondary | ICD-10-CM | POA: Diagnosis present

## 2020-05-28 DIAGNOSIS — F419 Anxiety disorder, unspecified: Secondary | ICD-10-CM | POA: Diagnosis present

## 2020-05-28 DIAGNOSIS — N3000 Acute cystitis without hematuria: Secondary | ICD-10-CM | POA: Diagnosis present

## 2020-05-28 DIAGNOSIS — Z79899 Other long term (current) drug therapy: Secondary | ICD-10-CM | POA: Diagnosis not present

## 2020-05-28 DIAGNOSIS — F79 Unspecified intellectual disabilities: Secondary | ICD-10-CM | POA: Diagnosis present

## 2020-05-28 DIAGNOSIS — Z20822 Contact with and (suspected) exposure to covid-19: Secondary | ICD-10-CM | POA: Diagnosis present

## 2020-05-28 DIAGNOSIS — I959 Hypotension, unspecified: Secondary | ICD-10-CM | POA: Diagnosis present

## 2020-05-28 DIAGNOSIS — R339 Retention of urine, unspecified: Secondary | ICD-10-CM | POA: Diagnosis present

## 2020-05-28 DIAGNOSIS — N12 Tubulo-interstitial nephritis, not specified as acute or chronic: Secondary | ICD-10-CM | POA: Diagnosis not present

## 2020-05-28 LAB — COMPREHENSIVE METABOLIC PANEL
ALT: 31 U/L (ref 0–44)
AST: 24 U/L (ref 15–41)
Albumin: 2.4 g/dL — ABNORMAL LOW (ref 3.5–5.0)
Alkaline Phosphatase: 89 U/L (ref 38–126)
Anion gap: 7 (ref 5–15)
BUN: 12 mg/dL (ref 8–23)
CO2: 23 mmol/L (ref 22–32)
Calcium: 7.6 mg/dL — ABNORMAL LOW (ref 8.9–10.3)
Chloride: 109 mmol/L (ref 98–111)
Creatinine, Ser: 0.8 mg/dL (ref 0.61–1.24)
GFR, Estimated: >60 mL/min (ref >60)
Glucose, Bld: 95 mg/dL (ref 70–99)
Potassium: 3.4 mmol/L — ABNORMAL LOW (ref 3.5–5.1)
Sodium: 139 mmol/L (ref 135–145)
Total Bilirubin: 0.7 mg/dL (ref 0.3–1.2)
Total Protein: 5.5 g/dL — ABNORMAL LOW (ref 6.5–8.1)

## 2020-05-28 LAB — URINE CULTURE: Culture: NO GROWTH

## 2020-05-28 LAB — CBC
HCT: 34.5 % — ABNORMAL LOW (ref 39.0–52.0)
Hemoglobin: 11.2 g/dL — ABNORMAL LOW (ref 13.0–17.0)
MCH: 31.4 pg (ref 26.0–34.0)
MCHC: 32.5 g/dL (ref 30.0–36.0)
MCV: 96.6 fL (ref 80.0–100.0)
Platelets: 200 10*3/uL (ref 150–400)
RBC: 3.57 MIL/uL — ABNORMAL LOW (ref 4.22–5.81)
RDW: 13.3 % (ref 11.5–15.5)
WBC: 6.9 10*3/uL (ref 4.0–10.5)
nRBC: 0 % (ref 0.0–0.2)

## 2020-05-28 MED ORDER — POTASSIUM CHLORIDE CRYS ER 20 MEQ PO TBCR
40.0000 meq | EXTENDED_RELEASE_TABLET | Freq: Once | ORAL | Status: AC
  Administered 2020-05-28: 40 meq via ORAL
  Filled 2020-05-28: qty 2

## 2020-05-28 NOTE — Progress Notes (Signed)
PROGRESS NOTE  Illene SilverBarry Toda ZOX:[id number] DOB: 21-Apr-1956 DOA: 05/27/2020 PCP: Rodrigo RanPerini, Mark, MD   LOS: 0 days   Brief Narrative / Interim history: 64 year old male with history of intellectual disability, urinary retention with Foley catheter, came into the hospital from urology clinic with weakness, dizziness, hypotension.  He was recently admitted to the hospital 3 weeks ago when he was diagnosed with a UTI and had acute urinary retention requiring Foley catheter placement.  He saw urology as an outpatient last week for a voiding trial however he failed that.  They changed the catheter and urine cultures were sent at that time.  He was initially placed empirically on Omnicef by PCP however about 4 days ago he was changed to ciprofloxacin as urine cultures grew Pseudomonas which was pansensitive.  Of note, during his initial hospital stay 3 weeks ago his urine cultures were negative and he was on empiric ceftriaxone  Subjective / 24h Interval events: He is doing well this morning, has no complaints.  No pain, no discomfort.  No shortness of breath.  Assessment & Plan: Principal Problem Sepsis due to Pseudomonas UTI -Patient was febrile, tachycardic, hypotensive on admission.  He was placed on cefepime, sepsis physiology improving however still has soft blood pressure this morning 92/67, continue intravenous antibiotics while awaiting cultures -Due to relative hypotension this morning continue IV fluids for another 24 hours -Hard to say if he failed outpatient antibiotics or it was a little bit later in the disease process -Obtain renal ultrasound to rule out an abscess -Unfortunately urine cultures during his prior hospital stay were negative so it is unclear whether Pseudomonas is new or has been there before and it wasn't picked up on micro  Active Problems Urinary retention, Foley catheter -New onset November 2021, patient has not had this problem before.  Is seen urology as an  outpatient, Dr. Sherron MondayMacdiarmid.  Patient sister asks for a urology consult today, I was able to talk with Dr. Laverle PatterBorden as patient's primary urologist is not on for the holidays. -He recommends outpatient follow-up, urodynamic studies once his infection is cleared -CT scan of the abdomen and pelvis done the beginning of this month did not show any nephrolithiasis  Intellectual disability, depression, anxiety -Continue home medications.   Scheduled Meds: . acidophilus  1 capsule Oral Daily  . Chlorhexidine Gluconate Cloth  6 each Topical Daily  . docusate sodium  100 mg Oral BID  . enoxaparin (LOVENOX) injection  40 mg Subcutaneous Q24H  . fesoterodine  4 mg Oral Daily  . fluticasone  1 spray Each Nare Daily  . mirtazapine  15 mg Oral QHS  . OLANZapine  2.5 mg Oral QHS   Continuous Infusions: . sodium chloride 100 mL/hr at 05/28/20 1159  . ceFEPime (MAXIPIME) IV 2 g (05/28/20 0508)   PRN Meds:.acetaminophen **OR** acetaminophen, ondansetron **OR** ondansetron (ZOFRAN) IV, polyethylene glycol  Diet Orders (From admission, onward)    Start     Ordered   05/27/20 1209  Diet regular Room service appropriate? Yes; Fluid consistency: Thin  Diet effective now       Question Answer Comment  Room service appropriate? Yes   Fluid consistency: Thin      05/27/20 1208          DVT prophylaxis: enoxaparin (LOVENOX) injection 40 mg Start: 05/27/20 1800     Code Status: Full Code  Family Communication: sister at bedside   Status is: Observation  The patient will require care spanning > 2 midnights and  should be moved to inpatient because: Hemodynamically unstable  Dispo: The patient is from: Home              Anticipated d/c is to: Home              Anticipated d/c date is: 2 days              Patient currently is not medically stable to d/c.  Consultants:  Dr Laverle Patter Urology by phone  Procedures:  Renal US - pending  Microbiology  Urine cultures - no growth,  final  Antimicrobials: Cefepime 11/23 >>    Objective: Vitals:   05/27/20 2047 05/27/20 2352 05/28/20 0502 05/28/20 0504  BP: (!) 81/55 116/86 92/67 92/67   Pulse: 89 87 83 83  Resp: 19 18 18    Temp: 99.4 F (37.4 C) 98.2 F (36.8 C) 98.2 F (36.8 C) 98.2 F (36.8 C)  TempSrc:  Oral Oral Oral  SpO2: 97% 94% 95% 95%  Weight:      Height:        Intake/Output Summary (Last 24 hours) at 05/28/2020 1308 Last data filed at 05/28/2020 0933 Gross per 24 hour  Intake 2153.76 ml  Output 1450 ml  Net 703.76 ml   Filed Weights   05/27/20 1450  Weight: 78.9 kg    Examination:  Constitutional: NAD Eyes: no scleral icterus ENMT: Mucous membranes are moist.  Neck: normal, supple Respiratory: clear to auscultation bilaterally, no wheezing, no crackles. Normal respiratory effort.  Cardiovascular: Regular rate and rhythm, no murmurs / rubs / gallops. No LE edema.  Abdomen: non distended, no tenderness. Bowel sounds positive.  Musculoskeletal: no clubbing / cyanosis.  Skin: no rashes Neurologic: non focal  Data Reviewed: I have independently reviewed following labs and imaging studies   CBC: Recent Labs  Lab 05/27/20 0923 05/28/20 0443  WBC 10.5 6.9  NEUTROABS 8.7*  --   HGB 13.3 11.2*  HCT 41.4 34.5*  MCV 97.6 96.6  PLT 219 200   Basic Metabolic Panel: Recent Labs  Lab 05/27/20 0923 05/28/20 0443  NA 138 139  K 3.5 3.4*  CL 105 109  CO2 24 23  GLUCOSE 145* 95  BUN 14 12  CREATININE 0.70 0.80  CALCIUM 8.5* 7.6*   Liver Function Tests: Recent Labs  Lab 05/27/20 0923 05/28/20 0443  AST 30 24  ALT 35 31  ALKPHOS 98 89  BILITOT 0.7 0.7  PROT 6.4* 5.5*  ALBUMIN 2.9* 2.4*   Coagulation Profile: Recent Labs  Lab 05/27/20 0923  INR 1.1   HbA1C: No results for input(s): HGBA1C in the last 72 hours. CBG: No results for input(s): GLUCAP in the last 168 hours.  Recent Results (from the past 240 hour(s))  Blood culture (routine single)     Status:  None (Preliminary result)   Collection Time: 05/27/20  9:23 AM   Specimen: BLOOD RIGHT HAND  Result Value Ref Range Status   Specimen Description   Final    BLOOD RIGHT HAND Performed at [facility], [address] [place], [place]    Special Requests   Final    BOTTLES DRAWN AEROBIC AND ANAEROBIC Blood Culture results may not be optimal due to an inadequate volume of blood received in culture bottles Performed at [facility], [address] [place], [place]    Culture   Final    NO GROWTH < 24 HOURS Performed at [facility], [address] [place], [place] [address]    Report Status PENDING  Incomplete  Urine culture     Status: None   Collection Time: 05/27/20 10:23 AM   Specimen: In/Out Cath Urine  Result Value Ref Range Status   Specimen Description   Final    IN/OUT CATH URINE Performed at [facility], [address] [place], [place] [address]    Special Requests   Final    NONE Performed at [facility], [address], [place], [place] [address]    Culture   Final    NO GROWTH Performed at [facility], [address], [place], [place] [address]    Report Status 05/28/2020 FINAL  Final  Culture, blood (single)     Status: None (Preliminary result)   Collection Time: 05/27/20 10:28 AM   Specimen: BLOOD  Result Value Ref Range Status   Specimen Description   Final    BLOOD RIGHT ANTECUBITAL Performed at [facility], [address] [place], [place] [address]    Special Requests   Final    BOTTLES DRAWN AEROBIC AND ANAEROBIC Blood Culture adequate volume Performed at [facility], [address], [place], [place] [address]    Culture   Final    NO GROWTH < 24 HOURS Performed at [facility], [address] [place], [place] [address]    Report Status PENDING  Incomplete  Resp Panel by RT-PCR (Flu A&B,  Covid) Nasopharyngeal Swab     Status: None   Collection Time: 05/27/20 10:28 AM   Specimen: Nasopharyngeal Swab; Nasopharyngeal(NP) swabs in vial transport medium  Result Value Ref Range Status   SARS Coronavirus 2 by RT PCR NEGATIVE NEGATIVE Final    Comment: (NOTE) SARS-CoV-2 target nucleic acids are NOT DETECTED.  The SARS-CoV-2 RNA is generally detectable in upper respiratory specimens during the acute phase of infection. The lowest concentration of SARS-CoV-2 viral copies this assay can detect is 138 copies/mL. A negative result does not preclude SARS-Cov-2 infection and should not be used as the sole basis for treatment or other patient management decisions. A negative result may occur with  improper specimen collection/handling, submission of specimen other than nasopharyngeal swab, presence of viral mutation(s) within the areas targeted by this assay, and inadequate number of viral copies(<138 copies/mL). A negative result must be combined with clinical observations, patient history, and epidemiological information. The expected result is Negative.  Fact Sheet for Patients:  BloggerCourse.com  Fact Sheet for Healthcare Providers:  SeriousBroker.it  This test is no t yet approved or cleared by the [place] FDA and  has been authorized for detection and/or diagnosis of SARS-CoV-2 by FDA under an Emergency Use Authorization (EUA). This EUA will remain  in effect (meaning this test can be used) for the duration of the COVID-19 declaration under Section 564(b)(1) of the Act, 21 U.S.C.section 360bbb-3(b)(1), unless the authorization is terminated  or revoked sooner.       Influenza A by PCR NEGATIVE NEGATIVE Final   Influenza B by PCR NEGATIVE NEGATIVE Final    Comment: (NOTE) The Xpert Xpress SARS-CoV-2/FLU/RSV plus assay is intended as an aid in the diagnosis of influenza from Nasopharyngeal swab specimens and should  not be used as a sole basis for treatment. Nasal washings and aspirates are unacceptable for Xpert Xpress SARS-CoV-2/FLU/RSV testing.  Fact Sheet for Patients: BloggerCourse.com  Fact Sheet for Healthcare Providers: SeriousBroker.it  This test is not yet approved or  cleared by the [place] and has been authorized for detection and/or diagnosis of SARS-CoV-2 by FDA under an Emergency Use Authorization (EUA). This EUA will remain in effect (meaning this test can be used) for the duration of the COVID-19 declaration under Section 564(b)(1) of the Act, 21 U.S.C. section 360bbb-3(b)(1), unless the authorization is terminated or revoked.  Performed at [facility], [address], [place], [place] [address]      Radiology Studies: No results found.   Time spent: 35 minutes in 3 visits   Pamella Pert, MD, PhD [facility]  Between 7 am - 7 pm I am available, please contact me via Amion or Securechat  Between 7 pm - 7 am I am not available, please contact night coverage MD/APP via Amion

## 2020-05-29 DIAGNOSIS — N3000 Acute cystitis without hematuria: Secondary | ICD-10-CM | POA: Diagnosis not present

## 2020-05-29 DIAGNOSIS — A419 Sepsis, unspecified organism: Secondary | ICD-10-CM | POA: Diagnosis not present

## 2020-05-29 LAB — CBC
HCT: 36.3 % — ABNORMAL LOW (ref 39.0–52.0)
Hemoglobin: 11.5 g/dL — ABNORMAL LOW (ref 13.0–17.0)
MCH: 31 pg (ref 26.0–34.0)
MCHC: 31.7 g/dL (ref 30.0–36.0)
MCV: 97.8 fL (ref 80.0–100.0)
Platelets: 206 10*3/uL (ref 150–400)
RBC: 3.71 MIL/uL — ABNORMAL LOW (ref 4.22–5.81)
RDW: 13.2 % (ref 11.5–15.5)
WBC: 6.1 10*3/uL (ref 4.0–10.5)
nRBC: 0 % (ref 0.0–0.2)

## 2020-05-29 LAB — COMPREHENSIVE METABOLIC PANEL
ALT: 34 U/L (ref 0–44)
AST: 28 U/L (ref 15–41)
Albumin: 2.5 g/dL — ABNORMAL LOW (ref 3.5–5.0)
Alkaline Phosphatase: 94 U/L (ref 38–126)
Anion gap: 7 (ref 5–15)
BUN: 11 mg/dL (ref 8–23)
CO2: 21 mmol/L — ABNORMAL LOW (ref 22–32)
Calcium: 7.8 mg/dL — ABNORMAL LOW (ref 8.9–10.3)
Chloride: 109 mmol/L (ref 98–111)
Creatinine, Ser: 0.74 mg/dL (ref 0.61–1.24)
GFR, Estimated: >60 mL/min (ref >60)
Glucose, Bld: 105 mg/dL — ABNORMAL HIGH (ref 70–99)
Potassium: 3.7 mmol/L (ref 3.5–5.1)
Sodium: 137 mmol/L (ref 135–145)
Total Bilirubin: 0.4 mg/dL (ref 0.3–1.2)
Total Protein: 5.7 g/dL — ABNORMAL LOW (ref 6.5–8.1)

## 2020-05-29 NOTE — Progress Notes (Signed)
PROGRESS NOTE  Paul Moreno PJK:[id number] DOB: 06-03-1956 DOA: 05/27/2020 PCP: [name], MD   LOS: 1 day   Brief Narrative / Interim history: 64 year old male with history of intellectual disability, urinary retention with Foley catheter, came into the hospital from urology clinic with weakness, dizziness, hypotension.  He was recently admitted to the hospital 3 weeks ago when he was diagnosed with a UTI and had acute urinary retention requiring Foley catheter placement.  He saw urology as an outpatient last week for a voiding trial however he failed that.  They changed the catheter and urine cultures were sent at that time.  He was initially placed empirically on Omnicef by PCP however about 4 days ago he was changed to ciprofloxacin as urine cultures grew Pseudomonas which was pansensitive.  Of note, during his initial hospital stay 3 weeks ago his urine cultures were negative and he was on empiric ceftriaxone  Subjective / 24h Interval events: No complaints, no pain, no discomfort.  No abdominal pain, no nausea or vomiting.  No fever or chills.  Assessment & Plan: Principal Problem Sepsis due to Pseudomonas UTI -Patient was febrile, tachycardic, hypotensive on admission.  He was placed on cefepime, sepsis physiology improving.  Blood pressure slightly better today -Continue intravenous antibiotics for now, if he is stable today anticipate discharge home tomorrow -Hard to say if he failed outpatient antibiotics or it was a little bit later in the disease process -Renal ultrasound negative for abscess or acute findings -Unfortunately urine cultures during his prior hospital stay were negative so it is unclear whether Pseudomonas is new or has been there before and it wasn't picked up on micro -urine cultures are negative again  Active Problems Urinary retention, Foley catheter -New onset November 2021, patient has not had this problem before.  Is seen urology as an outpatient, Dr.  Sherron Monday.  -Discussed case with Dr. Laverle Patter over the phone on 11/24, he recommends outpatient follow-up, urodynamic studies once his infection is cleared -CT scan of the abdomen and pelvis done the beginning of this month did not show any nephrolithiasis  Intellectual disability, depression, anxiety -Continue home medications.   Scheduled Meds: . acidophilus  1 capsule Oral Daily  . Chlorhexidine Gluconate Cloth  6 each Topical Daily  . docusate sodium  100 mg Oral BID  . enoxaparin (LOVENOX) injection  40 mg Subcutaneous Q24H  . fesoterodine  4 mg Oral Daily  . fluticasone  1 spray Each Nare Daily  . mirtazapine  15 mg Oral QHS  . OLANZapine  2.5 mg Oral QHS   Continuous Infusions: . ceFEPime (MAXIPIME) IV 2 g (05/29/20 0435)   PRN Meds:.acetaminophen **OR** acetaminophen, ondansetron **OR** ondansetron (ZOFRAN) IV, polyethylene glycol  Diet Orders (From admission, onward)    Start     Ordered   05/27/20 1209  Diet regular Room service appropriate? Yes; Fluid consistency: Thin  Diet effective now       Question Answer Comment  Room service appropriate? Yes   Fluid consistency: Thin      05/27/20 1208          DVT prophylaxis: enoxaparin (LOVENOX) injection 40 mg Start: 05/27/20 1800     Code Status: Full Code  Family Communication: sister at bedside   Status is: Observation  The patient will require care spanning > 2 midnights and should be moved to inpatient because: Hemodynamically unstable  Dispo: The patient is from: Home  Anticipated d/c is to: Home              Anticipated d/c date is: 2 days              Patient currently is not medically stable to d/c.  Consultants:  Dr Laverle Patter Urology by phone  Procedures:  Renal US - pending  Microbiology  Urine cultures - no growth, final  Antimicrobials: Cefepime 11/23 >>    Objective: Vitals:   05/28/20 0504 05/28/20 1447 05/28/20 2032 05/29/20 0442  BP: 92/67 103/79 98/64 98/67   Pulse: 83  88 88 72  Resp:  16 16 19   Temp: 98.2 F (36.8 C) 98.4 F (36.9 C) 98.4 F (36.9 C) 98.6 F (37 C)  TempSrc: Oral Oral Oral Oral  SpO2: 95% 98% 96% 94%  Weight:      Height:        Intake/Output Summary (Last 24 hours) at 05/29/2020 1115 Last data filed at 05/29/2020 0900 Gross per 24 hour  Intake 2170.98 ml  Output 650 ml  Net 1520.98 ml   Filed Weights   05/27/20 1450  Weight: 78.9 kg    Examination:  Constitutional: No distress Eyes: No icterus ENMT: mmm Neck: normal, supple Respiratory: Lungs are clear without wheezing, normal respiratory effort Cardiovascular: Regular rate and rhythm, no murmurs, no edema Abdomen: Soft, nontender, nondistended Musculoskeletal: no clubbing / cyanosis.  Skin: No rashes seen Neurologic: No focal deficits  Data Reviewed: I have independently reviewed following labs and imaging studies   CBC: Recent Labs  Lab 05/27/20 0923 05/28/20 0443 05/29/20 0351  WBC 10.5 6.9 6.1  NEUTROABS 8.7*  --   --   HGB 13.3 11.2* 11.5*  HCT 41.4 34.5* 36.3*  MCV 97.6 96.6 97.8  PLT 219 200 206   Basic Metabolic Panel: Recent Labs  Lab 05/27/20 0923 05/28/20 0443 05/29/20 0351  NA 138 139 137  K 3.5 3.4* 3.7  CL 105 109 109  CO2 24 23 21*  GLUCOSE 145* 95 105*  BUN 14 12 11   CREATININE 0.70 0.80 0.74  CALCIUM 8.5* 7.6* 7.8*   Liver Function Tests: Recent Labs  Lab 05/27/20 0923 05/28/20 0443 05/29/20 0351  AST 30 24 28   ALT 35 31 34  ALKPHOS 98 89 94  BILITOT 0.7 0.7 0.4  PROT 6.4* 5.5* 5.7*  ALBUMIN 2.9* 2.4* 2.5*   Coagulation Profile: Recent Labs  Lab 05/27/20 0923  INR 1.1   HbA1C: No results for input(s): HGBA1C in the last 72 hours. CBG: No results for input(s): GLUCAP in the last 168 hours.  Recent Results (from the past 240 hour(s))  Blood culture (routine single)     Status: None (Preliminary result)   Collection Time: 05/27/20  9:23 AM   Specimen: BLOOD RIGHT HAND  Result Value Ref Range Status    Specimen Description   Final    BLOOD RIGHT HAND Performed at [facility], [address], [place], [place]    Special Requests   Final    BOTTLES DRAWN AEROBIC AND ANAEROBIC Blood Culture results may not be optimal due to an inadequate volume of blood received in culture bottles Performed at [facility], [address], [place], [place]    Culture   Final    NO GROWTH 2 DAYS Performed at [facility], [address] [place], [facility] [address]    Report Status PENDING  Incomplete  Urine culture  Status: None   Collection Time: 05/27/20 10:23 AM   Specimen: In/Out Cath Urine  Result Value Ref Range Status   Specimen Description   Final    IN/OUT CATH URINE Performed at [facility], [address] [place], [place] [address]    Special Requests   Final    NONE Performed at [facility], [address] [place], [place] [address]    Culture   Final    NO GROWTH Performed at [facility], [address] [place], [place] [address]    Report Status 05/28/2020 FINAL  Final  Culture, blood (single)     Status: None (Preliminary result)   Collection Time: 05/27/20 10:28 AM   Specimen: BLOOD  Result Value Ref Range Status   Specimen Description   Final    BLOOD RIGHT ANTECUBITAL Performed at [facility], [address] [place], [place] [address]    Special Requests   Final    BOTTLES DRAWN AEROBIC AND ANAEROBIC Blood Culture adequate volume Performed at [facility], [address] [place], [place] [address]    Culture   Final    NO GROWTH 2 DAYS Performed at [facility], [address] [place], [place] [address]    Report Status PENDING  Incomplete  Resp Panel by RT-PCR (Flu A&B, Covid) Nasopharyngeal Swab     Status: None   Collection Time: 05/27/20 10:28 AM   Specimen: Nasopharyngeal Swab; Nasopharyngeal(NP)  swabs in vial transport medium  Result Value Ref Range Status   SARS Coronavirus 2 by RT PCR NEGATIVE NEGATIVE Final    Comment: (NOTE) SARS-CoV-2 target nucleic acids are NOT DETECTED.  The SARS-CoV-2 RNA is generally detectable in upper respiratory specimens during the acute phase of infection. The lowest concentration of SARS-CoV-2 viral copies this assay can detect is 138 copies/mL. A negative result does not preclude SARS-Cov-2 infection and should not be used as the sole basis for treatment or other patient management decisions. A negative result may occur with  improper specimen collection/handling, submission of specimen other than nasopharyngeal swab, presence of viral mutation(s) within the areas targeted by this assay, and inadequate number of viral copies(<138 copies/mL). A negative result must be combined with clinical observations, patient history, and epidemiological information. The expected result is Negative.  Fact Sheet for Patients:  BloggerCourse.comhttps://www.fda.gov/media/152166/download  Fact Sheet for Healthcare Providers:  SeriousBroker.ithttps://www.fda.gov/media/152162/download  This test is no t yet approved or cleared by the [place] FDA and  has been authorized for detection and/or diagnosis of SARS-CoV-2 by FDA under an Emergency Use Authorization (EUA). This EUA will remain  in effect (meaning this test can be used) for the duration of the COVID-19 declaration under Section 564(b)(1) of the Act, 21 U.S.C.section 360bbb-3(b)(1), unless the authorization is terminated  or revoked sooner.       Influenza A by PCR NEGATIVE NEGATIVE Final   Influenza B by PCR NEGATIVE NEGATIVE Final    Comment: (NOTE) The Xpert Xpress SARS-CoV-2/FLU/RSV plus assay is intended as an aid in the diagnosis of influenza from Nasopharyngeal swab specimens and should not be used as a sole basis for treatment. Nasal washings and aspirates are unacceptable for Xpert Xpress  SARS-CoV-2/FLU/RSV testing.  Fact Sheet for Patients: BloggerCourse.comhttps://www.fda.gov/media/152166/download  Fact Sheet for Healthcare Providers: SeriousBroker.ithttps://www.fda.gov/media/152162/download  This test is not yet approved or cleared by the [place] FDA and has been authorized for detection and/or diagnosis of SARS-CoV-2 by FDA under an  Emergency Use Authorization (EUA). This EUA will remain in effect (meaning this test can be used) for the duration of the COVID-19 declaration under Section 564(b)(1) of the Act, 21 U.S.C. section 360bbb-3(b)(1), unless the authorization is terminated or revoked.  Performed at [facility], [address], [place], [place] [address]      Radiology Studies: US RENAL  Result Date: 05/28/2020 CLINICAL DATA:  Pyelonephritis EXAM: RENAL / URINARY TRACT ULTRASOUND COMPLETE COMPARISON:  CT 05/07/2020 FINDINGS: Right Kidney: Renal measurements: 11.5 x 4.8 x 4.1 cm = volume: 119 mL. Echogenicity within normal limits. No mass or hydronephrosis visualized. Left Kidney: Renal measurements: 12.9 x 6.4 x 5.2 cm = volume: 210 mL. Echogenicity within normal limits. No mass or hydronephrosis visualized. Bladder: Collapsed, with Foley catheter. Other: None. IMPRESSION: Study within normal limits. No hydronephrosis. Normal size kidneys. Echogenicity is normal. No focal lesion. Foley catheter in the bladder. Electronically Signed   By: Paulina Fusi M.D.   On: 05/28/2020 14:39     Time spent: 35 minutes in 3 visits   Pamella Pert, MD, PhD [facility]  Between 7 am - 7 pm I am available, please contact me via Amion or Securechat  Between 7 pm - 7 am I am not available, please contact night coverage MD/APP via Amion

## 2020-05-30 DIAGNOSIS — A419 Sepsis, unspecified organism: Secondary | ICD-10-CM | POA: Diagnosis not present

## 2020-05-30 DIAGNOSIS — N3000 Acute cystitis without hematuria: Secondary | ICD-10-CM | POA: Diagnosis not present

## 2020-05-30 DIAGNOSIS — N12 Tubulo-interstitial nephritis, not specified as acute or chronic: Secondary | ICD-10-CM | POA: Diagnosis not present

## 2020-05-30 LAB — BASIC METABOLIC PANEL
Anion gap: 7 (ref 5–15)
BUN: 13 mg/dL (ref 8–23)
CO2: 24 mmol/L (ref 22–32)
Calcium: 7.9 mg/dL — ABNORMAL LOW (ref 8.9–10.3)
Chloride: 105 mmol/L (ref 98–111)
Creatinine, Ser: 0.86 mg/dL (ref 0.61–1.24)
GFR, Estimated: >60 mL/min (ref >60)
Glucose, Bld: 121 mg/dL — ABNORMAL HIGH (ref 70–99)
Potassium: 3.8 mmol/L (ref 3.5–5.1)
Sodium: 136 mmol/L (ref 135–145)

## 2020-05-30 LAB — CBC
HCT: 38.8 % — ABNORMAL LOW (ref 39.0–52.0)
Hemoglobin: 12.5 g/dL — ABNORMAL LOW (ref 13.0–17.0)
MCH: 31.4 pg (ref 26.0–34.0)
MCHC: 32.2 g/dL (ref 30.0–36.0)
MCV: 97.5 fL (ref 80.0–100.0)
Platelets: 219 10*3/uL (ref 150–400)
RBC: 3.98 MIL/uL — ABNORMAL LOW (ref 4.22–5.81)
RDW: 13 % (ref 11.5–15.5)
WBC: 6.9 10*3/uL (ref 4.0–10.5)
nRBC: 0 % (ref 0.0–0.2)

## 2020-05-30 MED ORDER — LEVOFLOXACIN 500 MG PO TABS: 500.0000 mg | ORAL_TABLET | Freq: Every day | ORAL | 0 refills | Status: AC

## 2020-05-30 NOTE — Plan of Care (Signed)
  Problem: Education: Goal: Knowledge of General Education information will improve Description: Including pain rating scale, medication(s)/side effects and non-pharmacologic comfort measures Outcome: Adequate for Discharge   Problem: Health Behavior/Discharge Planning: Goal: Ability to manage health-related needs will improve Outcome: Adequate for Discharge   Problem: Clinical Measurements: Goal: Ability to maintain clinical measurements within normal limits will improve Outcome: Adequate for Discharge Goal: Will remain free from infection Outcome: Adequate for Discharge Goal: Diagnostic test results will improve Outcome: Adequate for Discharge Goal: Respiratory complications will improve Outcome: Adequate for Discharge Goal: Cardiovascular complication will be avoided Outcome: Adequate for Discharge   Problem: Activity: Goal: Risk for activity intolerance will decrease Outcome: Adequate for Discharge   Problem: Nutrition: Goal: Adequate nutrition will be maintained Outcome: Adequate for Discharge   Problem: Coping: Goal: Level of anxiety will decrease Outcome: Adequate for Discharge   Problem: Elimination: Goal: Will not experience complications related to bowel motility Outcome: Adequate for Discharge Goal: Will not experience complications related to urinary retention Outcome: Adequate for Discharge   Problem: Pain Managment: Goal: General experience of comfort will improve Outcome: Adequate for Discharge   Problem: Safety: Goal: Ability to remain free from injury will improve Outcome: Adequate for Discharge   Problem: Skin Integrity: Goal: Risk for impaired skin integrity will decrease Outcome: Adequate for Discharge   Problem: Urinary Elimination: Goal: Signs and symptoms of infection will decrease Outcome: Adequate for Discharge   Problem: Urinary Elimination: Goal: Signs and symptoms of infection will decrease Outcome: Adequate for Discharge    Problem: Education: Goal: Knowledge of General Education information will improve Description: Including pain rating scale, medication(s)/side effects and non-pharmacologic comfort measures Outcome: Adequate for Discharge   Problem: Health Behavior/Discharge Planning: Goal: Ability to manage health-related needs will improve Outcome: Adequate for Discharge   Problem: Clinical Measurements: Goal: Ability to maintain clinical measurements within normal limits will improve Outcome: Adequate for Discharge Goal: Will remain free from infection Outcome: Adequate for Discharge Goal: Diagnostic test results will improve Outcome: Adequate for Discharge Goal: Respiratory complications will improve Outcome: Adequate for Discharge Goal: Cardiovascular complication will be avoided Outcome: Adequate for Discharge   Problem: Activity: Goal: Risk for activity intolerance will decrease Outcome: Adequate for Discharge   Problem: Nutrition: Goal: Adequate nutrition will be maintained Outcome: Adequate for Discharge   Problem: Coping: Goal: Level of anxiety will decrease Outcome: Adequate for Discharge   Problem: Elimination: Goal: Will not experience complications related to bowel motility Outcome: Adequate for Discharge Goal: Will not experience complications related to urinary retention Outcome: Adequate for Discharge   Problem: Pain Managment: Goal: General experience of comfort will improve Outcome: Adequate for Discharge   Problem: Safety: Goal: Ability to remain free from injury will improve Outcome: Adequate for Discharge   Problem: Skin Integrity: Goal: Risk for impaired skin integrity will decrease Outcome: Adequate for Discharge

## 2020-05-30 NOTE — Progress Notes (Signed)
Patient discharged via wheelchair accompanied by sister and staff. Pt. Is alert and oriented, not in any distress. Discharge instruction and teachings discussed with sister and patient, both verbalized understanding. AVS given to patient. All patient belongings are sent home with the family.

## 2020-05-30 NOTE — Discharge Summary (Signed)
Physician Discharge Summary  Paul Moreno WUJ:[id number] DOB: [birth year] DOA: 05/27/2020  PCP: [name], MD  Admit date: 05/27/2020 Discharge date: 05/30/2020  Admitted From: group home Disposition:  Group home  Recommendations for Outpatient Follow-up:  1. Follow up with Dr Sherron Monday in 1 week 2. Please obtain BMP/CBC in one week 3. Please ensure cleanliness at the urinary catheter site   Home Health: none Equipment/Devices: none  Discharge Condition: stable CODE STATUS: Full code Diet recommendation: regular  HPI: Per admitting MD, Paul Moreno is a 64 y.o. male with medical history significant of intellectual disability, urinary retention on chronic foley. Presenting with weakness, dizziness, hypotension. Recent admission for Paul Moreno secondary to UTI. Went home with foley d/t urinary retention. Saw urology last week (Wed) for a voiding trial; however, he failed that voiding trial. They changed his catheter and sent off cultures at that time. His cultures were positive for PSA. He was started on cipro Friday. Over the weekend, he suffered lightheadedness, dizziness. He didn't try any medicines to help. He just tried rest. He went to [facility] today for a follow up voiding trial. In clinic he was hypotensive and tachycardic. The staff was concerned for Paul Moreno, so he was sent to the ED.   Hospital Course / Discharge diagnoses: Principal Problem Paul Moreno due to Pseudomonas UTI -Patient was febrile, tachycardic, hypotensive on admission.  He was placed on cefepime, Paul Moreno physiology resolved. His urine cultures in the hospital have remained negative thus antibiotic choice was per outpatient urine cultures in Urology office (Pseudomonas - pansensitive). Will narrow to Levaquin on discharge (of note, he was on Cipro for 3 days prior to this hospitalization). Renal ultrasound negative for abscess or acute findings  Active Problems Urinary retention, Foley catheter -New onset November  2021, patient has not had this problem before.  Is seen urology as an outpatient, Dr. Sherron Monday. -Discussed case with Dr. Laverle Patter over the phone on 11/24, he recommends outpatient follow-up, urodynamic studies once his infection is cleared. CT scan of the abdomen and pelvis done the beginning of this month did not show any nephrolithiasis Intellectual disability, depression, anxiety -Continue home medications.  Discharge Instructions   Allergies as of 05/30/2020   No Known Allergies     Medication List    STOP taking these medications   ciprofloxacin 250 MG tablet Commonly known as: CIPRO     TAKE these medications   acetaminophen 500 MG tablet Commonly known as: TYLENOL Take 500 mg by mouth every 6 (six) hours as needed for moderate pain.   Align 4 MG Caps Take 4 mg by mouth daily.   augmented betamethasone dipropionate 0.05 % cream Commonly known as: DIPROLENE-AF Apply 1 application topically 2 (two) times daily as needed (dry skin).   cetaphil lotion Apply 1 application topically as needed for dry skin.   fluticasone 50 MCG/ACT nasal spray Commonly known as: FLONASE Place 1 spray into both nostrils daily.   ketoconazole 2 % cream Commonly known as: NIZORAL Apply 1 application topically at bedtime.   ketoconazole 2 % shampoo Commonly known as: NIZORAL Apply 1 application topically 3 (three) times a week. On MWF   levofloxacin 500 MG tablet Commonly known as: Levaquin Take 1 tablet (500 mg total) by mouth daily for 7 days.   metroNIDAZOLE 0.75 % cream Commonly known as: METROCREAM Apply 1 application topically daily.   mirtazapine 15 MG tablet Commonly known as: REMERON Take 15 mg by mouth at bedtime.   OCUSOFT LID SCRUB EX Apply 1  application topically in the morning and at bedtime. Apply ocusoft lid scrub pad twice a day after warm compresses   OLANZapine 2.5 MG tablet Commonly known as: ZYPREXA Take 2.5 mg by mouth at bedtime.   polyethylene glycol  17 g packet Commonly known as: MIRALAX / GLYCOLAX Take 17 g by mouth 2 (two) times daily as needed for mild constipation.   tamsulosin 0.4 MG Caps capsule Commonly known as: FLOMAX Take 0.4 mg by mouth daily.   Toviaz 4 MG Tb24 tablet Generic drug: fesoterodine Take 4 mg by mouth daily.       Consultations:  None   Procedures/Studies:  US RENAL  Result Date: Jun 02, 2020 CLINICAL DATA:  Pyelonephritis EXAM: RENAL / URINARY TRACT ULTRASOUND COMPLETE COMPARISON:  CT 05/07/2020 FINDINGS: Right Kidney: Renal measurements: 11.5 x 4.8 x 4.1 cm = volume: 119 mL. Echogenicity within normal limits. No mass or hydronephrosis visualized. Left Kidney: Renal measurements: 12.9 x 6.4 x 5.2 cm = volume: 210 mL. Echogenicity within normal limits. No mass or hydronephrosis visualized. Bladder: Collapsed, with Foley catheter. Other: None. IMPRESSION: Study within normal limits. No hydronephrosis. Normal size kidneys. Echogenicity is normal. No focal lesion. Foley catheter in the bladder. Electronically Signed   By: Paulina Fusi M.D.   On: Jun 02, 2020 14:39   DG Chest Port 1 View  Result Date: 05/27/2020 CLINICAL DATA:  Paul Moreno EXAM: PORTABLE CHEST 1 VIEW COMPARISON:  05/07/2020 FINDINGS: The heart size and mediastinal contours are stable. No focal airspace consolidation, pleural effusion, or pneumothorax. The visualized skeletal structures are unremarkable. IMPRESSION: No active disease. Electronically Signed   By: Duanne Guess D.O.   On: 05/27/2020 10:20   DG Chest Port 1 View  Result Date: 05/07/2020 CLINICAL DATA:  Elevated white blood cell count EXAM: PORTABLE CHEST 1 VIEW COMPARISON:  02/27/2020 FINDINGS: Single frontal view of the chest demonstrates an unremarkable cardiac silhouette. No airspace disease, effusion, or pneumothorax. The 8 mm left lower lobe pulmonary nodule seen on recent chest CT is not visible by radiography. No acute bony abnormalities. IMPRESSION: 1. No acute intrathoracic  process. 2. Previously identified 8 mm left lower lobe pulmonary nodule not identified by radiograph. Please refer to previous CT report for recommendations regarding follow-up. Electronically Signed   By: Sharlet Salina M.D.   On: 05/07/2020 19:52   CT Renal Stone Study  Result Date: 05/07/2020 CLINICAL DATA:  Renal failure.  Acute urinary retention EXAM: CT ABDOMEN AND PELVIS WITHOUT CONTRAST TECHNIQUE: Multidetector CT imaging of the abdomen and pelvis was performed following the standard protocol without IV contrast. COMPARISON:  None. FINDINGS: Lower chest: Bibasilar atelectasis. Hepatobiliary: Scattered hypodensities in the liver difficult to characterize on this noncontrast study, but favor cysts. Gallbladder unremarkable. Pancreas: No focal abnormality or ductal dilatation. Spleen: No focal abnormality.  Normal size. Adrenals/Urinary Tract: Foley catheter present in the bladder which is decompressed. No hydronephrosis. No renal or adrenal mass. Stomach/Bowel: Stomach, large and small bowel grossly unremarkable. Normal appendix. Vascular/Lymphatic: No evidence of aneurysm or adenopathy. Reproductive: Prostate enlargement with a transverse diameter of 6.6 cm. Central calcifications. Other: No free fluid or free air. Musculoskeletal: No acute bony abnormality. IMPRESSION: Decompression a bladder with Foley catheter in place. No hydronephrosis. Scattered hypodensities in the liver, favor cysts, but cannot be characterized on this noncontrast study. Bibasilar atelectasis. Prostate enlargement. Electronically Signed   By: Charlett Nose M.D.   On: 05/07/2020 20:40   US ABDOMEN LIMITED RUQ (LIVER/GB)  Result Date: 05/11/2020 CLINICAL DATA:  Elevated liver enzymes  EXAM: ULTRASOUND ABDOMEN LIMITED RIGHT UPPER QUADRANT COMPARISON:  CT renal stone protocol 05/07/2020 FINDINGS: Gallbladder: No gallstones or wall thickening visualized. No sonographic Murphy sign noted by sonographer. Common bile duct: Diameter: 0.5  cm, within normal limits Liver: No focal lesion identified. Within normal limits in parenchymal echogenicity. Portal vein is patent on color Doppler imaging with normal direction of blood flow towards the liver. Other: None. IMPRESSION: No sonographic finding to explain the patient's abnormal liver enzymes. Electronically Signed   By: Emmaline Kluver M.D.   On: 05/11/2020 12:27     Subjective: - no chest pain, shortness of breath, no abdominal pain, nausea or vomiting.   Discharge Exam: BP 105/66 (BP Location: Right Arm)   Pulse 84   Temp 98.2 F (36.8 C)   Resp 16   Ht 5\' 10"  (1.778 m)   Wt 78.9 kg   SpO2 95%   BMI 24.95 kg/m   General: Pt is alert, awake, not in acute distress Cardiovascular: RRR, S1/S2 +, no rubs, no gallops Respiratory: CTA bilaterally, no wheezing, no rhonchi Abdominal: Soft, NT, ND, bowel sounds + Extremities: no edema, no cyanosis   The results of significant diagnostics from this hospitalization (including imaging, microbiology, ancillary and laboratory) are listed below for reference.     Microbiology: Recent Results (from the past 240 hour(s))  Blood culture (routine single)     Status: None (Preliminary result)   Collection Time: 05/27/20  9:23 AM   Specimen: BLOOD RIGHT HAND  Result Value Ref Range Status   Specimen Description   Final    BLOOD RIGHT HAND Performed at [facility], [address], [place], [place]    Special Requests   Final    BOTTLES DRAWN AEROBIC AND ANAEROBIC Blood Culture results may not be optimal due to an inadequate volume of blood received in culture bottles Performed at [facility], [address] [place], [place]    Culture   Final    NO GROWTH 2 DAYS Performed at [facility], [address], [place], [place]    Report Status PENDING  Incomplete  Urine culture     Status: None   Collection Time: 05/27/20 10:23 AM   Specimen: In/Out  Cath Urine  Result Value Ref Range Status   Specimen Description   Final    IN/OUT CATH URINE Performed at [facility], [address] [place], [place]    Special Requests   Final    NONE Performed at [facility], [address], [place], [place]    Culture   Final    NO GROWTH Performed at [facility], [address], [place], [place]    Report Status 05/28/2020 FINAL  Final  Culture, blood (single)     Status: None (Preliminary result)   Collection Time: 05/27/20 10:28 AM   Specimen: BLOOD  Result Value Ref Range Status   Specimen Description   Final    BLOOD RIGHT ANTECUBITAL Performed at [facility], [address] [place], [place]    Special Requests   Final    BOTTLES DRAWN AEROBIC AND ANAEROBIC Blood Culture adequate volume Performed at [facility], [address], [place], [place]    Culture   Final    NO GROWTH 2 DAYS Performed at [facility], [address], [place], [place]  Report Status PENDING  Incomplete  Resp Panel by RT-PCR (Flu A&B, Covid) Nasopharyngeal Swab     Status: None   Collection Time: 05/27/20 10:28 AM   Specimen: Nasopharyngeal Swab; Nasopharyngeal(NP) swabs in vial transport medium  Result Value Ref Range Status   SARS Coronavirus 2 by RT PCR NEGATIVE NEGATIVE Final    Comment: (NOTE) SARS-CoV-2 target nucleic acids are NOT DETECTED.  The SARS-CoV-2 RNA is generally detectable in upper respiratory specimens during the acute phase of infection. The lowest concentration of SARS-CoV-2 viral copies this assay can detect is 138 copies/mL. A negative result does not preclude SARS-Cov-2 infection and should not be used as the sole basis for treatment or other patient management decisions. A negative result may occur with  improper specimen collection/handling, submission of specimen  other than nasopharyngeal swab, presence of viral mutation(s) within the areas targeted by this assay, and inadequate number of viral copies(<138 copies/mL). A negative result must be combined with clinical observations, patient history, and epidemiological information. The expected result is Negative.  Fact Sheet for Patients:  BloggerCourse.com  Fact Sheet for Healthcare Providers:  SeriousBroker.it  This test is no t yet approved or cleared by the [place] FDA and  has been authorized for detection and/or diagnosis of SARS-CoV-2 by FDA under an Emergency Use Authorization (EUA). This EUA will remain  in effect (meaning this test can be used) for the duration of the COVID-19 declaration under Section 564(b)(1) of the Act, 21 U.S.C.section 360bbb-3(b)(1), unless the authorization is terminated  or revoked sooner.       Influenza A by PCR NEGATIVE NEGATIVE Final   Influenza B by PCR NEGATIVE NEGATIVE Final    Comment: (NOTE) The Xpert Xpress SARS-CoV-2/FLU/RSV plus assay is intended as an aid in the diagnosis of influenza from Nasopharyngeal swab specimens and should not be used as a sole basis for treatment. Nasal washings and aspirates are unacceptable for Xpert Xpress SARS-CoV-2/FLU/RSV testing.  Fact Sheet for Patients: BloggerCourse.com  Fact Sheet for Healthcare Providers: SeriousBroker.it  This test is not yet approved or cleared by the [place] FDA and has been authorized for detection and/or diagnosis of SARS-CoV-2 by FDA under an Emergency Use Authorization (EUA). This EUA will remain in effect (meaning this test can be used) for the duration of the COVID-19 declaration under Section 564(b)(1) of the Act, 21 U.S.C. section 360bbb-3(b)(1), unless the authorization is terminated or revoked.  Performed at [facility], [address], [place], [place] [address]      Labs: Basic Metabolic Panel: Recent Labs  Lab 05/27/20 [id number] 05/28/20 0443 05/29/20 0351 05/30/20 0426  NA 138 139 137 136  K 3.5 3.4* 3.7 3.8  CL 105 109 109 105  CO2 24 23 21* 24  GLUCOSE 145* 95 105* 121*  BUN 14 12 11 13   CREATININE 0.70 0.80 0.74 0.86  CALCIUM 8.5* 7.6* 7.8* 7.9*   Liver Function Tests: Recent Labs  Lab 05/27/20 0923 05/28/20 0443 05/29/20 0351  AST 30 24 28   ALT 35 31 34  ALKPHOS 98 89 94  BILITOT 0.7 0.7 0.4  PROT 6.4* 5.5* 5.7*  ALBUMIN 2.9* 2.4* 2.5*   CBC: Recent Labs  Lab 05/27/20 0923 05/28/20 0443 05/29/20 0351 05/30/20 0426  WBC 10.5 6.9 6.1 6.9  NEUTROABS 8.7*  --   --   --   HGB 13.3 11.2* 11.5* 12.5*  HCT 41.4 34.5* 36.3* 38.8*  MCV 97.6 96.6 97.8 97.5  PLT 219 200 206  219   CBG: No results for input(s): GLUCAP in the last 168 hours. Hgb A1c No results for input(s): HGBA1C in the last 72 hours. Lipid Profile No results for input(s): CHOL, HDL, LDLCALC, TRIG, CHOLHDL, LDLDIRECT in the last 72 hours. Thyroid function studies No results for input(s): TSH, T4TOTAL, T3FREE, THYROIDAB in the last 72 hours.  Invalid input(s): FREET3 Urinalysis    Component Value Date/Time   COLORURINE STRAW (A) 05/27/2020 1023   APPEARANCEUR CLEAR 05/27/2020 1023   LABSPEC 1.006 05/27/2020 1023   PHURINE 6.0 05/27/2020 1023   GLUCOSEU NEGATIVE 05/27/2020 1023   HGBUR NEGATIVE 05/27/2020 1023   BILIRUBINUR NEGATIVE 05/27/2020 1023   KETONESUR NEGATIVE 05/27/2020 1023   PROTEINUR NEGATIVE 05/27/2020 1023   NITRITE NEGATIVE 05/27/2020 1023   LEUKOCYTESUR TRACE (A) 05/27/2020 1023    FURTHER DISCHARGE INSTRUCTIONS:   Get Medicines reviewed and adjusted: Please take all your medications with you for your next visit with your Primary MD   Laboratory/radiological data: Please request your Primary MD to go over all hospital tests and procedure/radiological results at the follow up, please ask your  Primary MD to get all Hospital records sent to his/her office.   In some cases, they will be blood work, cultures and biopsy results pending at the time of your discharge. Please request that your primary care M.D. goes through all the records of your hospital data and follows up on these results.   Also Note the following: If you experience worsening of your admission symptoms, develop shortness of breath, life threatening emergency, suicidal or homicidal thoughts you must seek medical attention immediately by calling 911 or calling your MD immediately  if symptoms less severe.   You must read complete instructions/literature along with all the possible adverse reactions/side effects for all the Medicines you take and that have been prescribed to you. Take any new Medicines after you have completely understood and accpet all the possible adverse reactions/side effects.    Do not drive when taking Pain medications or sleeping medications (Benzodaizepines)   Do not take more than prescribed Pain, Sleep and Anxiety Medications. It is not advisable to combine anxiety,sleep and pain medications without talking with your primary care practitioner   Special Instructions: If you have smoked or chewed Tobacco  in the last 2 yrs please stop smoking, stop any regular Alcohol  and or any Recreational drug use.   Wear Seat belts while driving.   Please note: You were cared for by a hospitalist during your hospital stay. Once you are discharged, your primary care physician will handle any further medical issues. Please note that NO REFILLS for any discharge medications will be authorized once you are discharged, as it is imperative that you return to your primary care physician (or establish a relationship with a primary care physician if you do not have one) for your post hospital discharge needs so that they can reassess your need for medications and monitor your lab values.  Time coordinating discharge: 35  minutes  SIGNED:  Pamella Pertostin Monserrath Junio, MD, PhD 05/30/2020, 6:40 AM

## 2020-06-01 LAB — CULTURE, BLOOD (SINGLE)
Culture: NO GROWTH
Culture: NO GROWTH
Special Requests: ADEQUATE

## 2020-06-04 DIAGNOSIS — R338 Other retention of urine: Secondary | ICD-10-CM | POA: Diagnosis not present

## 2020-06-04 DIAGNOSIS — N3000 Acute cystitis without hematuria: Secondary | ICD-10-CM | POA: Diagnosis not present

## 2020-06-09 DIAGNOSIS — N390 Urinary tract infection, site not specified: Secondary | ICD-10-CM | POA: Diagnosis not present

## 2020-06-09 DIAGNOSIS — R82998 Other abnormal findings in urine: Secondary | ICD-10-CM | POA: Diagnosis not present

## 2020-06-09 DIAGNOSIS — R339 Retention of urine, unspecified: Secondary | ICD-10-CM | POA: Diagnosis not present

## 2020-06-13 DIAGNOSIS — F419 Anxiety disorder, unspecified: Secondary | ICD-10-CM | POA: Diagnosis not present

## 2020-06-13 DIAGNOSIS — Z79899 Other long term (current) drug therapy: Secondary | ICD-10-CM | POA: Diagnosis not present

## 2020-06-13 DIAGNOSIS — N401 Enlarged prostate with lower urinary tract symptoms: Secondary | ICD-10-CM | POA: Diagnosis not present

## 2020-06-13 DIAGNOSIS — F70 Mild intellectual disabilities: Secondary | ICD-10-CM | POA: Diagnosis not present

## 2020-06-13 DIAGNOSIS — F32A Depression, unspecified: Secondary | ICD-10-CM | POA: Diagnosis not present

## 2020-06-13 DIAGNOSIS — N179 Acute kidney failure, unspecified: Secondary | ICD-10-CM | POA: Diagnosis not present

## 2020-06-13 DIAGNOSIS — N390 Urinary tract infection, site not specified: Secondary | ICD-10-CM | POA: Diagnosis not present

## 2020-06-13 DIAGNOSIS — R6521 Severe sepsis with septic shock: Secondary | ICD-10-CM | POA: Diagnosis not present

## 2020-06-13 DIAGNOSIS — Z9181 History of falling: Secondary | ICD-10-CM | POA: Diagnosis not present

## 2020-06-13 DIAGNOSIS — A419 Sepsis, unspecified organism: Secondary | ICD-10-CM | POA: Diagnosis not present

## 2020-06-13 DIAGNOSIS — R338 Other retention of urine: Secondary | ICD-10-CM | POA: Diagnosis not present

## 2020-06-18 DIAGNOSIS — R338 Other retention of urine: Secondary | ICD-10-CM | POA: Diagnosis not present

## 2020-06-23 DIAGNOSIS — R339 Retention of urine, unspecified: Secondary | ICD-10-CM | POA: Diagnosis not present

## 2020-06-23 DIAGNOSIS — I959 Hypotension, unspecified: Secondary | ICD-10-CM | POA: Diagnosis not present

## 2020-06-23 DIAGNOSIS — N390 Urinary tract infection, site not specified: Secondary | ICD-10-CM | POA: Diagnosis not present

## 2020-06-24 DIAGNOSIS — R350 Frequency of micturition: Secondary | ICD-10-CM | POA: Diagnosis not present

## 2020-06-24 DIAGNOSIS — R3914 Feeling of incomplete bladder emptying: Secondary | ICD-10-CM | POA: Diagnosis not present

## 2020-07-08 DIAGNOSIS — R159 Full incontinence of feces: Secondary | ICD-10-CM | POA: Diagnosis not present

## 2020-07-08 DIAGNOSIS — R7301 Impaired fasting glucose: Secondary | ICD-10-CM | POA: Diagnosis not present

## 2020-07-08 DIAGNOSIS — F79 Unspecified intellectual disabilities: Secondary | ICD-10-CM | POA: Diagnosis not present

## 2020-07-08 DIAGNOSIS — N3281 Overactive bladder: Secondary | ICD-10-CM | POA: Diagnosis not present

## 2020-07-08 DIAGNOSIS — R339 Retention of urine, unspecified: Secondary | ICD-10-CM | POA: Diagnosis not present

## 2020-07-13 DIAGNOSIS — Z9181 History of falling: Secondary | ICD-10-CM | POA: Diagnosis not present

## 2020-07-13 DIAGNOSIS — N390 Urinary tract infection, site not specified: Secondary | ICD-10-CM | POA: Diagnosis not present

## 2020-07-13 DIAGNOSIS — A419 Sepsis, unspecified organism: Secondary | ICD-10-CM | POA: Diagnosis not present

## 2020-07-13 DIAGNOSIS — N179 Acute kidney failure, unspecified: Secondary | ICD-10-CM | POA: Diagnosis not present

## 2020-07-13 DIAGNOSIS — N401 Enlarged prostate with lower urinary tract symptoms: Secondary | ICD-10-CM | POA: Diagnosis not present

## 2020-07-13 DIAGNOSIS — F70 Mild intellectual disabilities: Secondary | ICD-10-CM | POA: Diagnosis not present

## 2020-07-13 DIAGNOSIS — F419 Anxiety disorder, unspecified: Secondary | ICD-10-CM | POA: Diagnosis not present

## 2020-07-13 DIAGNOSIS — Z79899 Other long term (current) drug therapy: Secondary | ICD-10-CM | POA: Diagnosis not present

## 2020-07-13 DIAGNOSIS — R6521 Severe sepsis with septic shock: Secondary | ICD-10-CM | POA: Diagnosis not present

## 2020-07-13 DIAGNOSIS — F32A Depression, unspecified: Secondary | ICD-10-CM | POA: Diagnosis not present

## 2020-07-13 DIAGNOSIS — R338 Other retention of urine: Secondary | ICD-10-CM | POA: Diagnosis not present

## 2020-07-14 DIAGNOSIS — N401 Enlarged prostate with lower urinary tract symptoms: Secondary | ICD-10-CM | POA: Diagnosis not present

## 2020-07-14 DIAGNOSIS — R338 Other retention of urine: Secondary | ICD-10-CM | POA: Diagnosis not present

## 2020-08-07 DIAGNOSIS — R338 Other retention of urine: Secondary | ICD-10-CM | POA: Diagnosis not present

## 2020-08-22 DIAGNOSIS — R972 Elevated prostate specific antigen [PSA]: Secondary | ICD-10-CM | POA: Diagnosis not present

## 2020-08-22 DIAGNOSIS — R3914 Feeling of incomplete bladder emptying: Secondary | ICD-10-CM | POA: Diagnosis not present

## 2020-08-22 DIAGNOSIS — N401 Enlarged prostate with lower urinary tract symptoms: Secondary | ICD-10-CM | POA: Diagnosis not present

## 2020-08-26 DIAGNOSIS — N401 Enlarged prostate with lower urinary tract symptoms: Secondary | ICD-10-CM | POA: Diagnosis not present

## 2020-08-26 DIAGNOSIS — R3914 Feeling of incomplete bladder emptying: Secondary | ICD-10-CM | POA: Diagnosis not present

## 2020-09-02 DIAGNOSIS — L718 Other rosacea: Secondary | ICD-10-CM | POA: Diagnosis not present

## 2020-09-02 DIAGNOSIS — L308 Other specified dermatitis: Secondary | ICD-10-CM | POA: Diagnosis not present

## 2020-09-02 DIAGNOSIS — L218 Other seborrheic dermatitis: Secondary | ICD-10-CM | POA: Diagnosis not present

## 2020-09-13 IMAGING — CT CT CARDIAC CORONARY ARTERY CALCIUM SCORE
3 series · 12 of 20 positions shown, 14 images · non-contrast
Comparison: None.

CLINICAL DATA: Hyperlipidemia

EXAM:
CT CARDIAC CORONARY ARTERY CALCIUM SCORE
TECHNIQUE: Non-contrast imaging through the heart was performed using
prospective ECG gating. Image post processing was performed on an
independent workstation, allowing for quantitative analysis of the
heart and coronary arteries. Note that this exam targets the heart
and the chest was not imaged in its entirety.

[Series 2: calcium scoring 2.00 qr36 bestdiast 73% hrt calciu · axial · 0.37mm/px · z∈[+1709,+1741]mm · 2 of 80 slices shown]
[im 16/80  vessel]
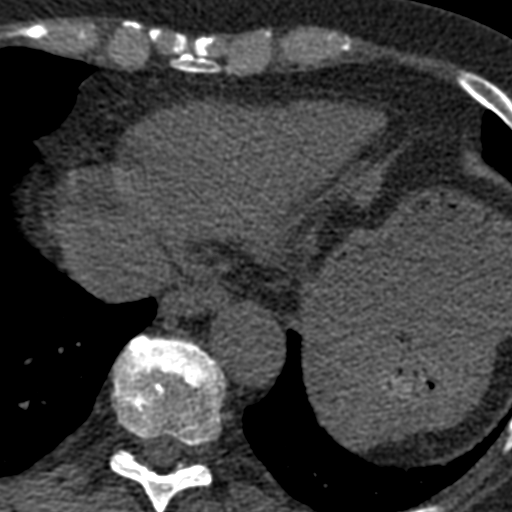
[im 32/80  vessel]
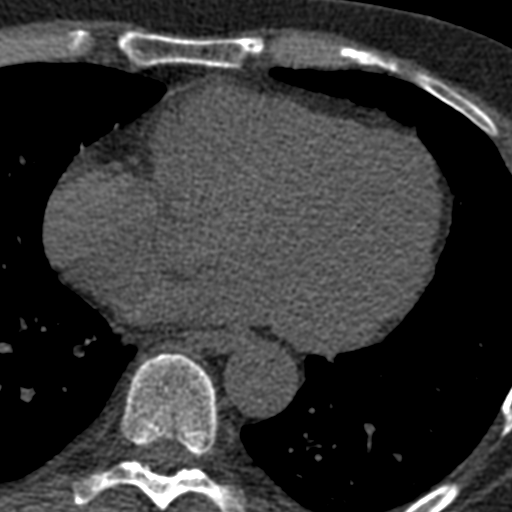

[Series 3: calcium scoring 2.00 br40 bestdiast 73% axial · axial · 0.50mm/px · z∈[+1705,+1809]mm · 5 of 80 slices shown, 7 images]
[im 14/80  vessel]
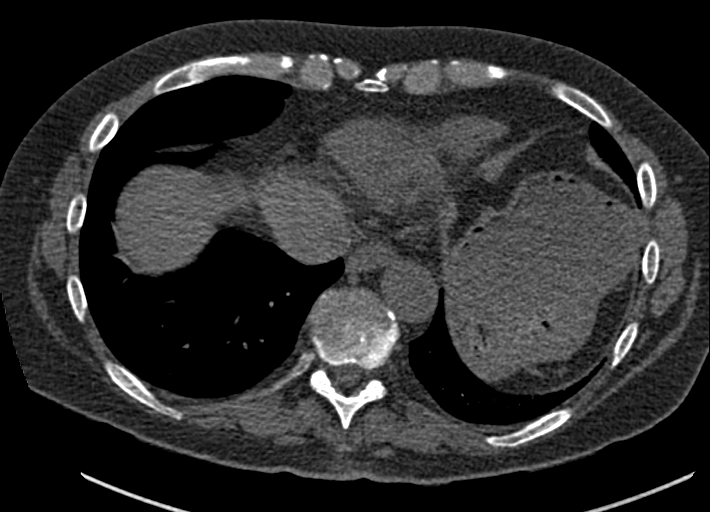
[im 14/80  lung]
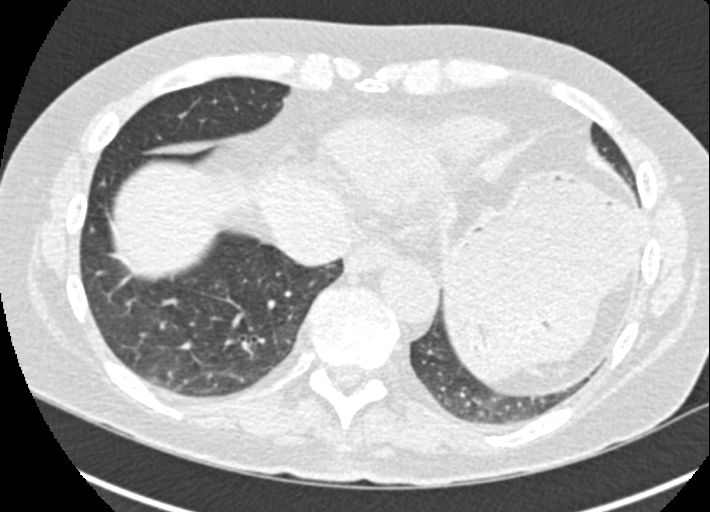
[im 27/80  vessel]
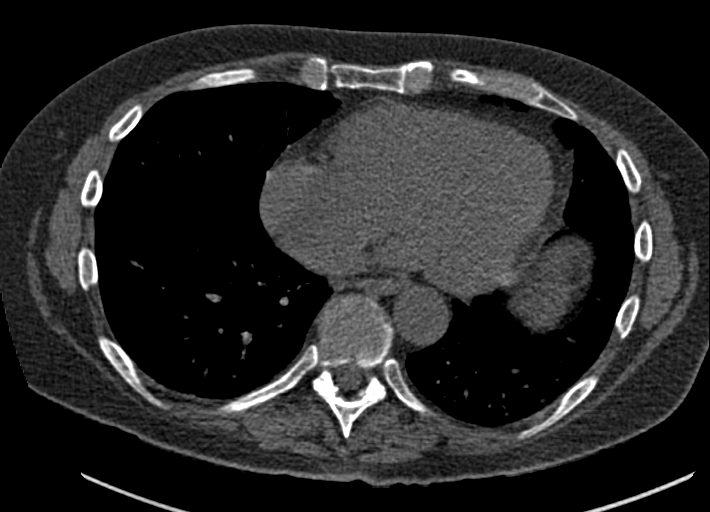
[im 40/80  vessel]
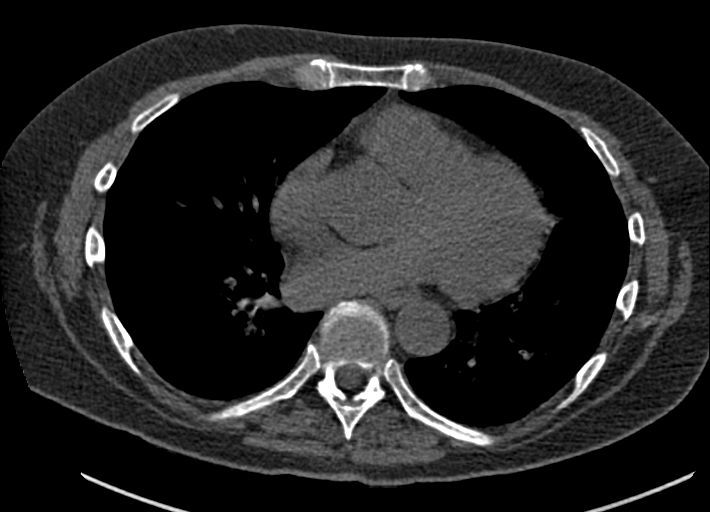
[im 53/80  vessel]
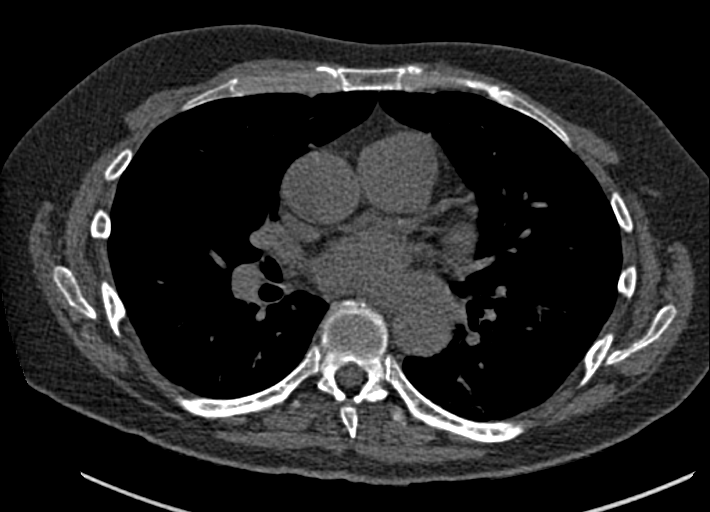
[im 66/80  vessel]
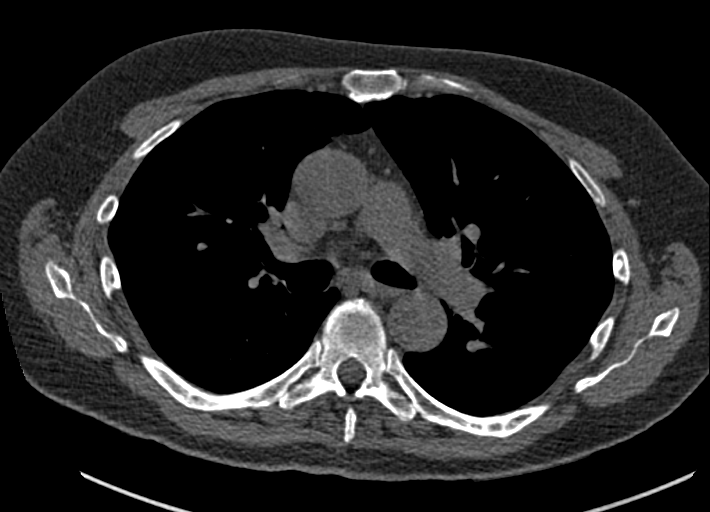
[im 66/80  lung]
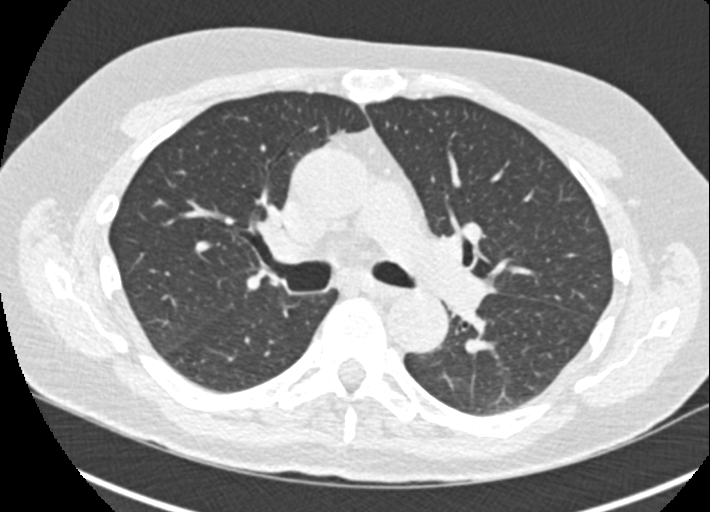

[Series 9: calcium scoring 2.00 br60 bestdiast 73% lungs · axial · 0.48mm/px · z∈[+1705,+1809]mm · 5 of 80 slices shown]
[im 14/80  vessel]
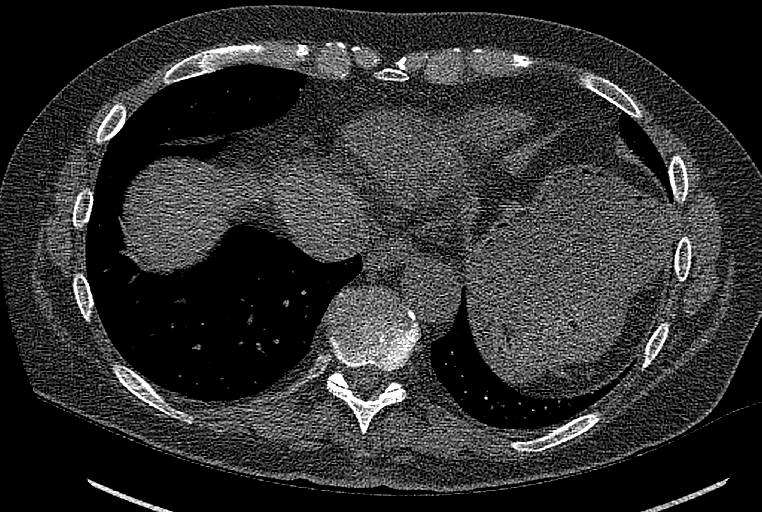
[im 27/80  vessel]
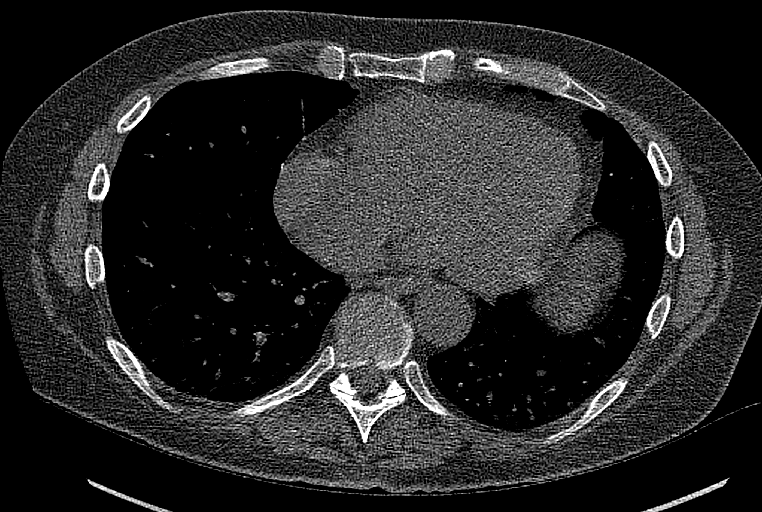
[im 40/80  vessel]
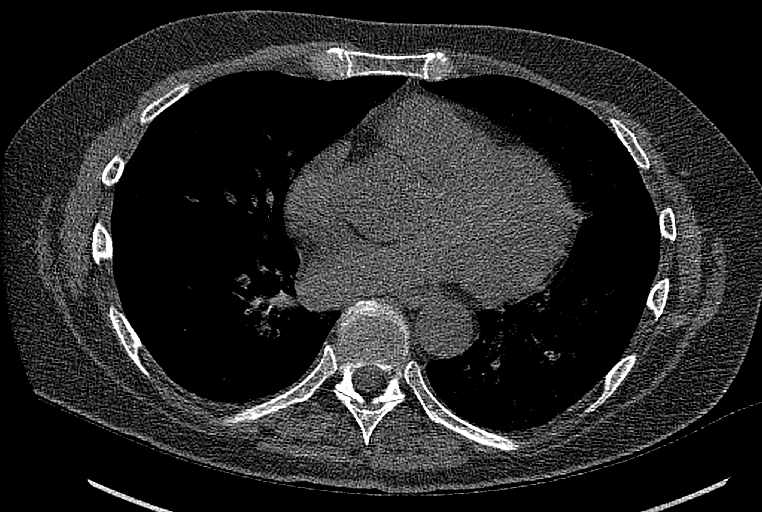
[im 53/80  vessel]
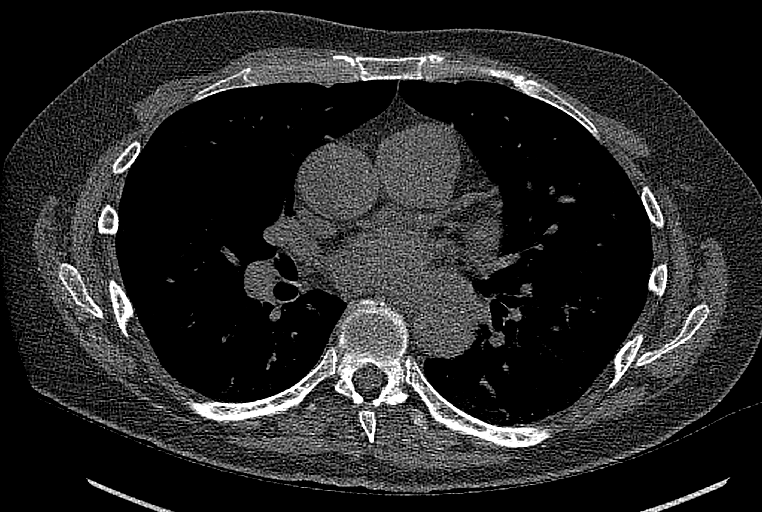
[im 66/80  vessel]
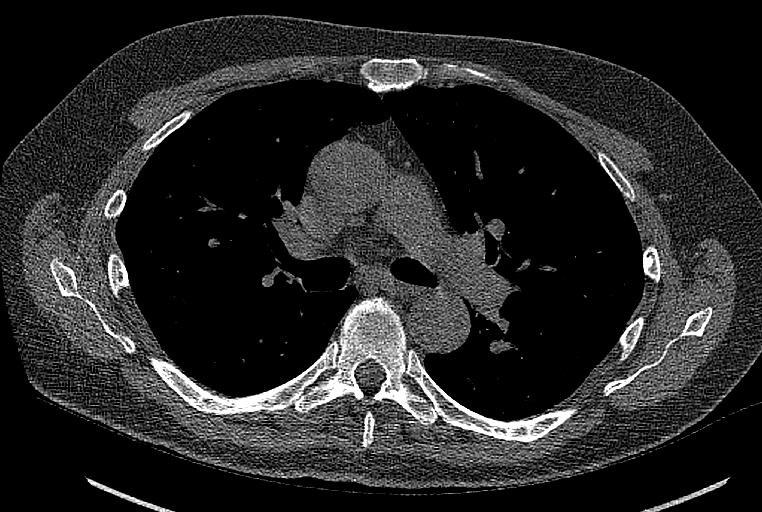

[12 of 20 positions shown; findings below may reference images not displayed]

FINDINGS: CORONARY CALCIUM SCORES:

Left Main: 0

LAD: 0

LCx: 0

RCA: 0

Total Agatston Score: 0

[HOSPITAL] percentile: 0

AORTA MEASUREMENTS:

Ascending Aorta: 35 mm

Descending Aorta: 28 mm

OTHER FINDINGS:

Heart is borderline in size. Aorta normal caliber. No adenopathy. 8
mm nodule in the left lower lobe posteriorly on image 45 of series
9. No effusions. Imaging into the upper abdomen demonstrates no
acute findings. Small hypodensities scattered in the visualized
upper liver, likely small cysts. Chest wall soft tissues are
unremarkable. No acute bony abnormality.
IMPRESSION: No visible coronary artery calcifications. Total coronary calcium
score of 0.

8 mm left lower lobe pulmonary nodule. Non-contrast chest CT at 6-12
months is recommended. If the nodule is stable at time of repeat CT,
then future CT at 18-24 months (from today's scan) is considered
optional for low-risk patients, but is recommended for high-risk
patients. This recommendation follows the consensus statement:
Guidelines for Management of Incidental Pulmonary Nodules Detected

## 2020-09-22 DIAGNOSIS — R3914 Feeling of incomplete bladder emptying: Secondary | ICD-10-CM | POA: Diagnosis not present

## 2020-11-03 DIAGNOSIS — F79 Unspecified intellectual disabilities: Secondary | ICD-10-CM | POA: Diagnosis not present

## 2020-11-03 DIAGNOSIS — N3281 Overactive bladder: Secondary | ICD-10-CM | POA: Diagnosis not present

## 2020-11-03 DIAGNOSIS — R159 Full incontinence of feces: Secondary | ICD-10-CM | POA: Diagnosis not present

## 2020-11-03 DIAGNOSIS — R339 Retention of urine, unspecified: Secondary | ICD-10-CM | POA: Diagnosis not present

## 2020-11-26 IMAGING — US US ABDOMEN LIMITED
1 series · 14 of 25 positions shown · non-contrast
Comparison: CT renal stone protocol 05/07/2020

CLINICAL DATA: Elevated liver enzymes

EXAM:
ULTRASOUND ABDOMEN LIMITED RIGHT UPPER QUADRANT

[Series 1: us abdomen limited · 31 acquisitions, 14 frames shown]
[im 1/31]
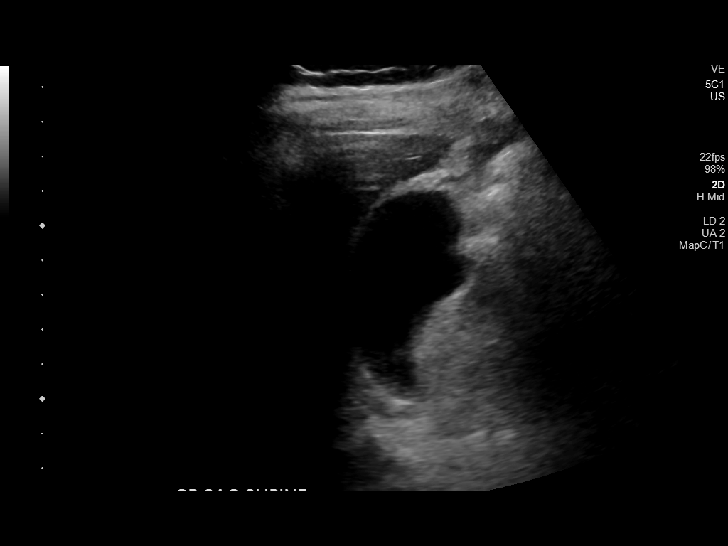
[im 3/31]
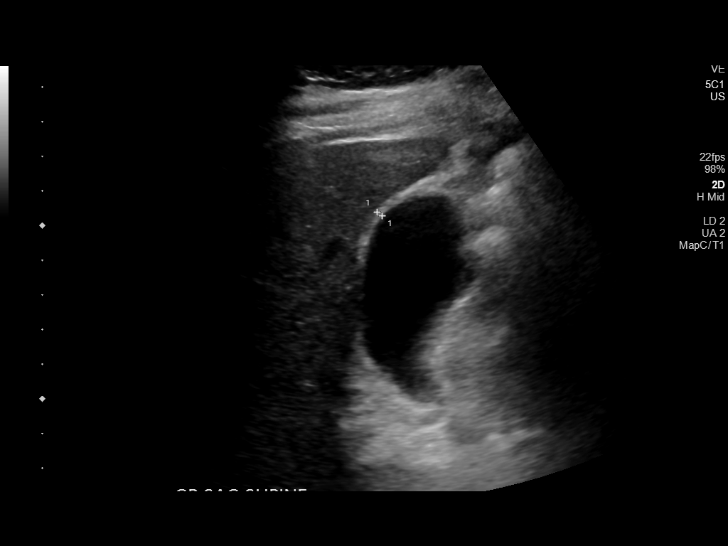
[im 6/31]
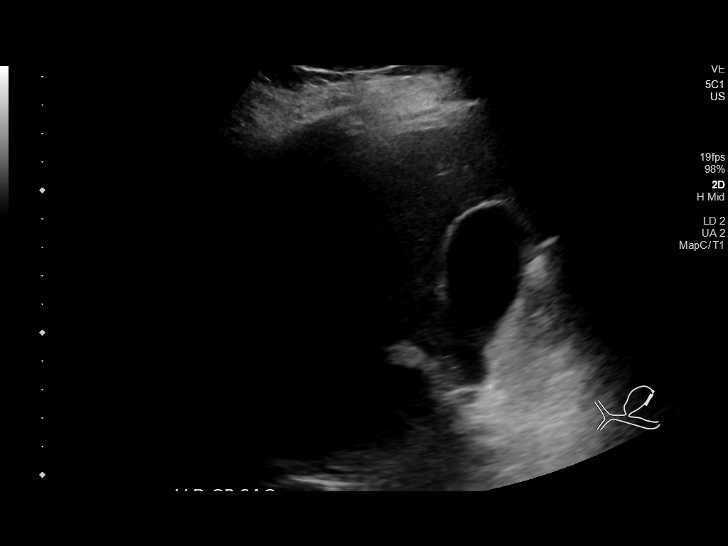
[im 8/31]
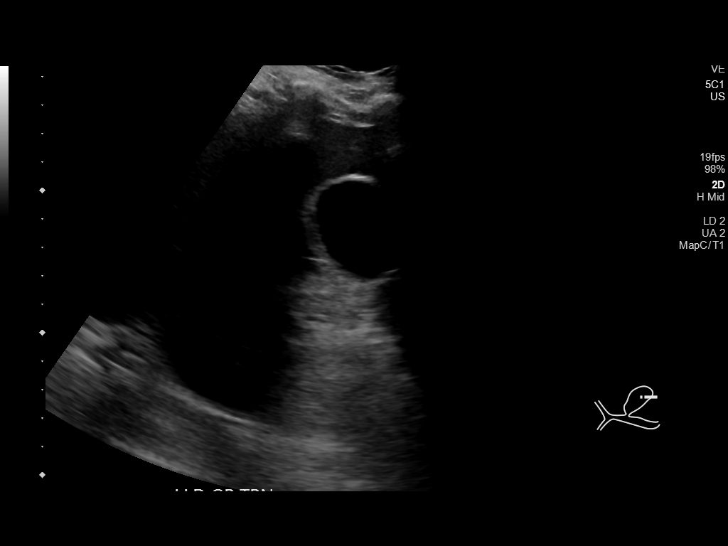
[im 11/31]
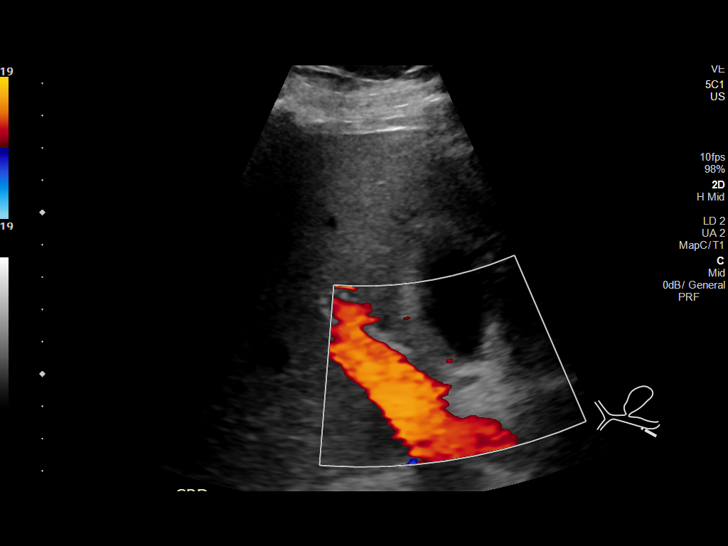
[im 12/31]
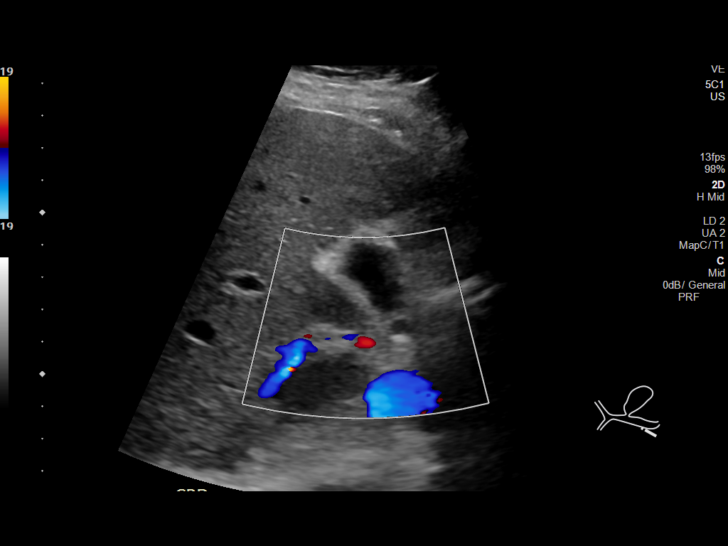
[im 14/31]
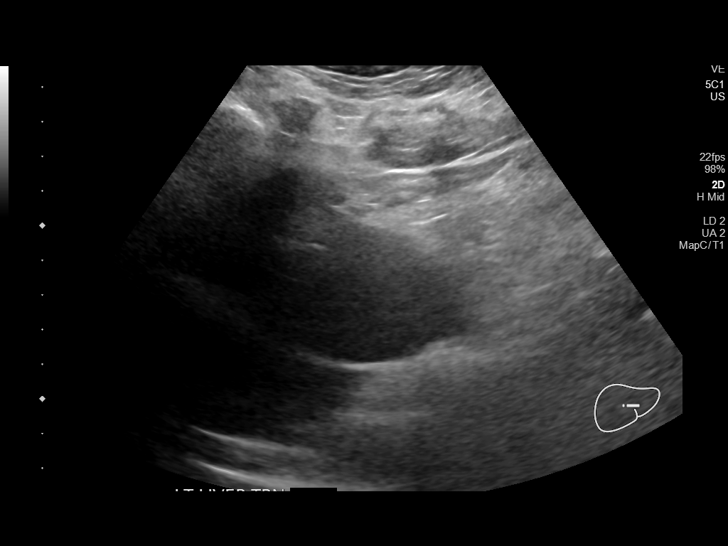
[im 17/31]
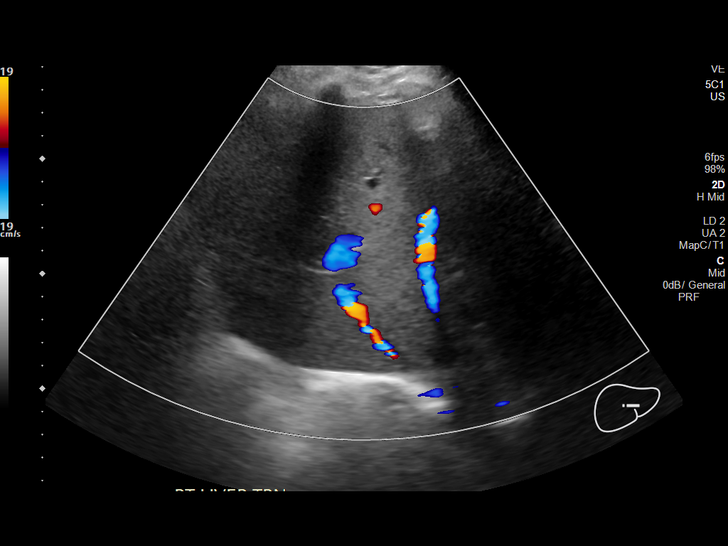
[im 19/31]
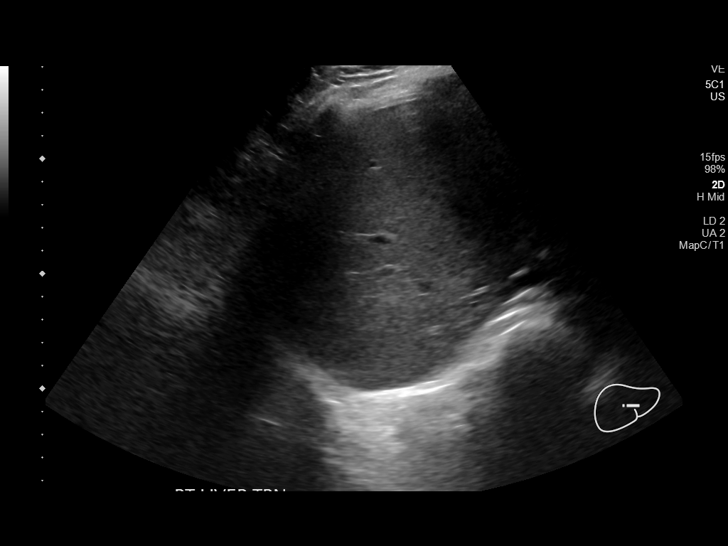
[im 21/31]
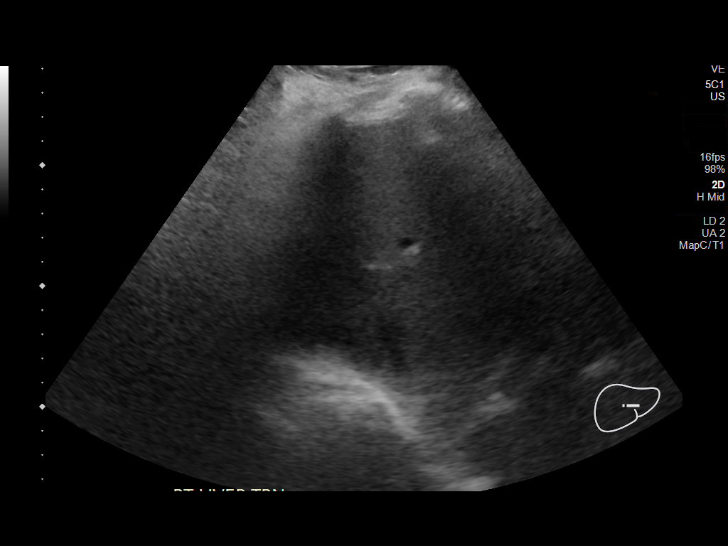
[im 23/31]
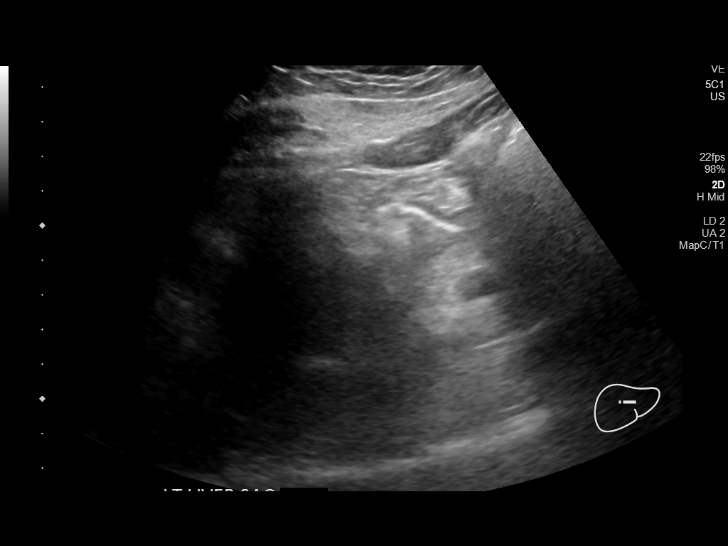
[im 26/31]
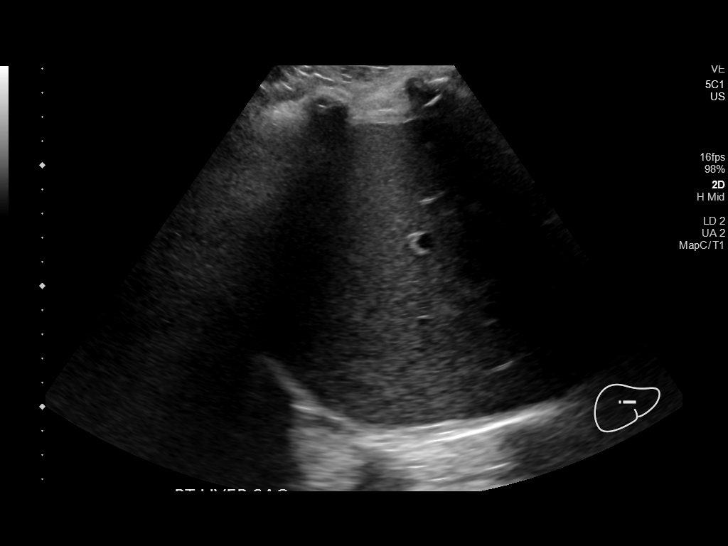
[im 28/31]
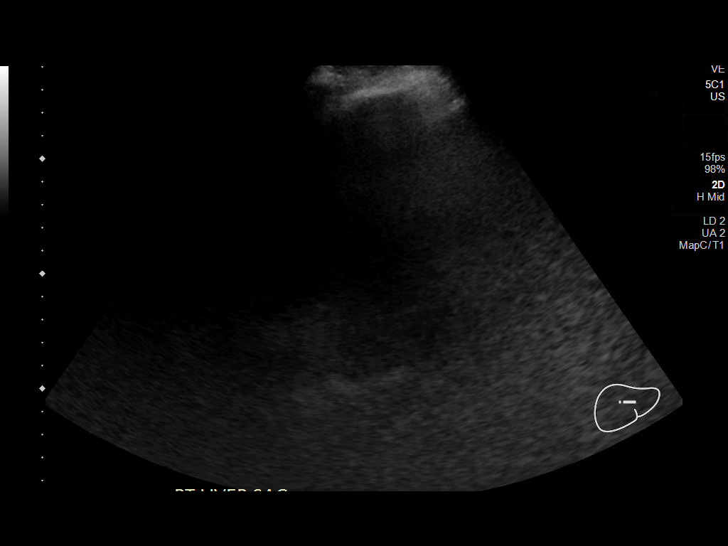
[im 31/31]
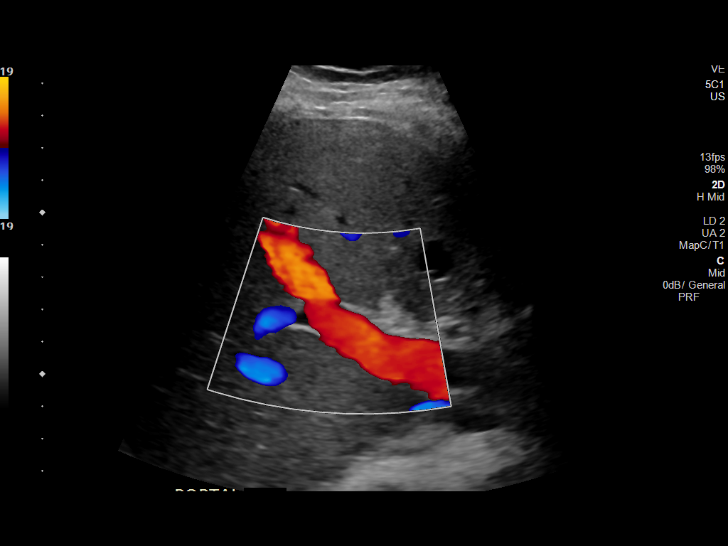

[14 of 25 positions shown; findings below may reference images not displayed]

FINDINGS: Gallbladder:

No gallstones or wall thickening visualized. No sonographic Murphy
sign noted by sonographer.

Common bile duct:

Diameter: 0.5 cm, within normal limits

Liver:

No focal lesion identified. Within normal limits in parenchymal
echogenicity. Portal vein is patent on color Doppler imaging with
normal direction of blood flow towards the liver.

Other: None.
IMPRESSION: No sonographic finding to explain the patient's abnormal liver
enzymes.

## 2021-03-11 DIAGNOSIS — E785 Hyperlipidemia, unspecified: Secondary | ICD-10-CM | POA: Diagnosis not present

## 2021-03-11 DIAGNOSIS — Z125 Encounter for screening for malignant neoplasm of prostate: Secondary | ICD-10-CM | POA: Diagnosis not present

## 2021-03-11 DIAGNOSIS — E0590 Thyrotoxicosis, unspecified without thyrotoxic crisis or storm: Secondary | ICD-10-CM | POA: Diagnosis not present

## 2021-03-11 DIAGNOSIS — R7301 Impaired fasting glucose: Secondary | ICD-10-CM | POA: Diagnosis not present

## 2021-03-18 ENCOUNTER — Other Ambulatory Visit: Payer: Self-pay | Admitting: Internal Medicine

## 2021-03-18 DIAGNOSIS — Z0000 Encounter for general adult medical examination without abnormal findings: Secondary | ICD-10-CM | POA: Diagnosis not present

## 2021-03-18 DIAGNOSIS — E785 Hyperlipidemia, unspecified: Secondary | ICD-10-CM

## 2021-03-18 DIAGNOSIS — F79 Unspecified intellectual disabilities: Secondary | ICD-10-CM | POA: Diagnosis not present

## 2021-03-18 DIAGNOSIS — R058 Other specified cough: Secondary | ICD-10-CM | POA: Diagnosis not present

## 2021-04-16 DIAGNOSIS — H2513 Age-related nuclear cataract, bilateral: Secondary | ICD-10-CM | POA: Diagnosis not present

## 2021-04-16 DIAGNOSIS — H5203 Hypermetropia, bilateral: Secondary | ICD-10-CM | POA: Diagnosis not present

## 2021-04-21 ENCOUNTER — Ambulatory Visit
Admission: RE | Admit: 2021-04-21 | Discharge: 2021-04-21 | Disposition: A | Discharge status: Home / Self Care | Payer: Medicare Other | Source: Ambulatory Visit | Attending: Internal Medicine | Admitting: Internal Medicine

## 2021-04-21 ENCOUNTER — Other Ambulatory Visit: Payer: Self-pay | Admitting: Internal Medicine

## 2021-04-21 DIAGNOSIS — R918 Other nonspecific abnormal finding of lung field: Secondary | ICD-10-CM | POA: Diagnosis not present

## 2021-04-21 DIAGNOSIS — I700 Atherosclerosis of aorta: Secondary | ICD-10-CM | POA: Diagnosis not present

## 2021-04-21 DIAGNOSIS — E785 Hyperlipidemia, unspecified: Secondary | ICD-10-CM

## 2021-04-21 DIAGNOSIS — R911 Solitary pulmonary nodule: Secondary | ICD-10-CM

## 2021-04-21 DIAGNOSIS — J9811 Atelectasis: Secondary | ICD-10-CM | POA: Diagnosis not present

## 2021-04-27 ENCOUNTER — Other Ambulatory Visit: Payer: Self-pay | Admitting: Internal Medicine

## 2021-04-27 DIAGNOSIS — R918 Other nonspecific abnormal finding of lung field: Secondary | ICD-10-CM

## 2021-04-28 DIAGNOSIS — N401 Enlarged prostate with lower urinary tract symptoms: Secondary | ICD-10-CM | POA: Diagnosis not present

## 2021-05-06 DIAGNOSIS — R350 Frequency of micturition: Secondary | ICD-10-CM | POA: Diagnosis not present

## 2021-05-06 DIAGNOSIS — N401 Enlarged prostate with lower urinary tract symptoms: Secondary | ICD-10-CM | POA: Diagnosis not present

## 2021-11-06 IMAGING — CT CT CHEST W/O CM
2 of 4 series · 11 of 36 positions shown, 13 images · non-contrast
Comparison: 02/27/2020

CLINICAL DATA: Pulmonary nodule.  Follow-up examination.

EXAM:
CT CHEST WITHOUT CONTRAST
TECHNIQUE: Multidetector CT imaging of the chest was performed following the
standard protocol without IV contrast.

[Series 2: chest 2.00 br40 s3 · axial · 0.54mm/px · z∈[+1539,+1787]mm · 8 of 148 slices shown, 10 images (1 of 2)]
[im 12/148  mediastinal]
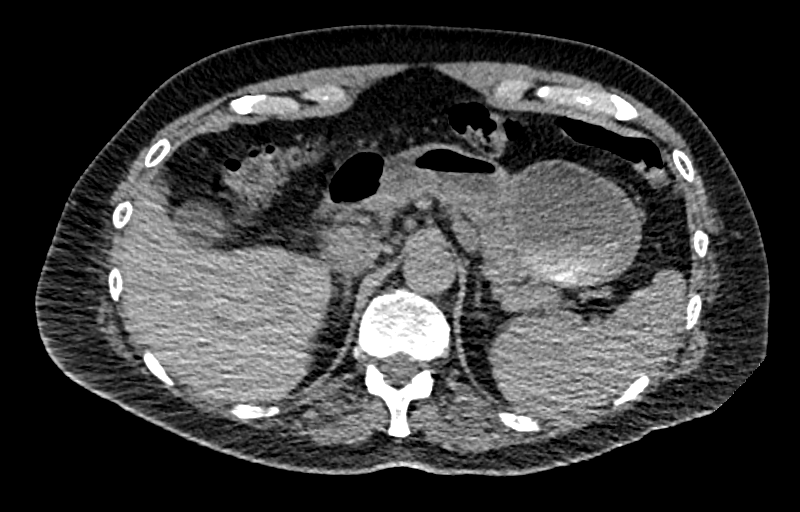
[im 12/148  lung]
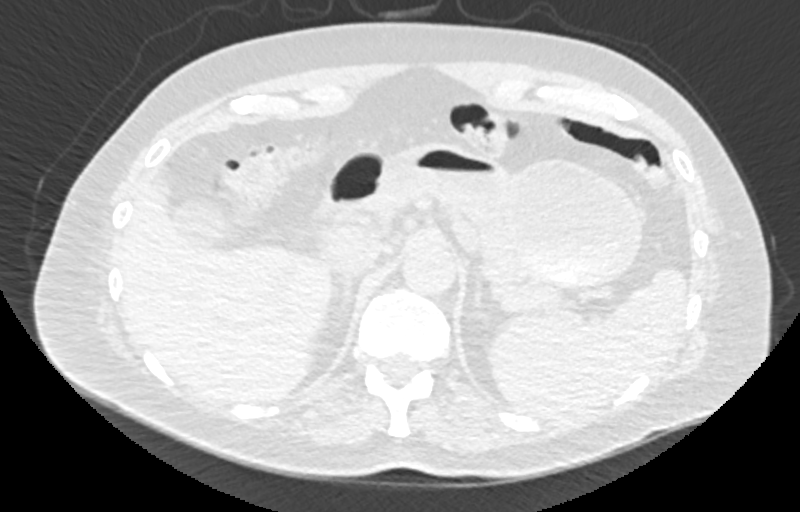
[im 34/148  lung]
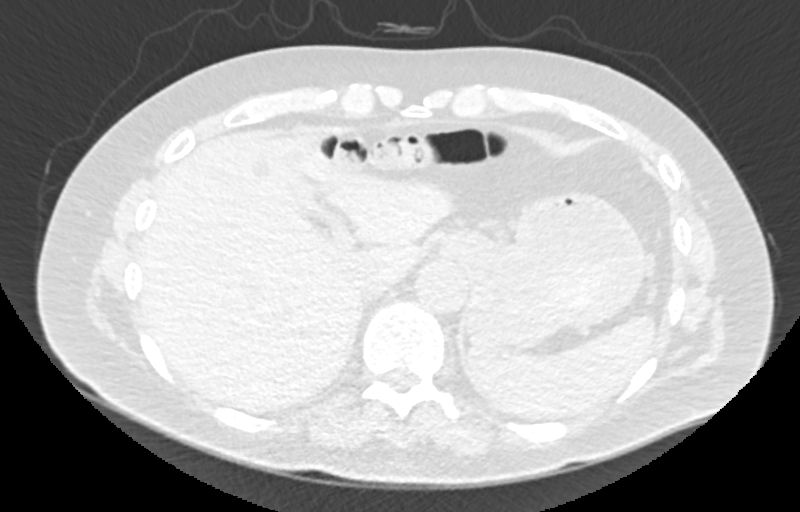
[im 46/148  lung]
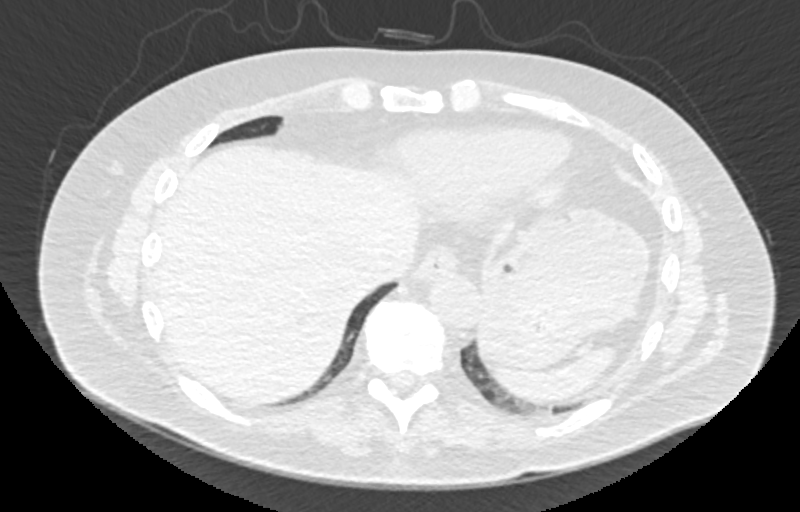
[im 68/148  lung]
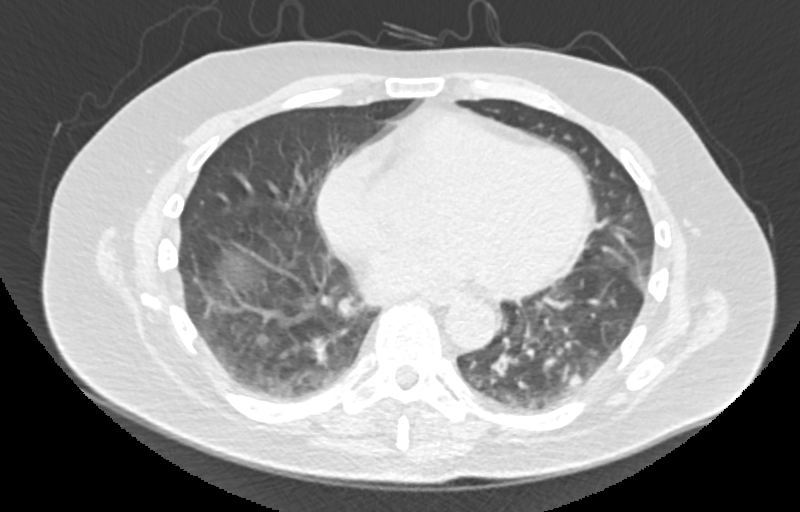
[im 80/148  mediastinal]
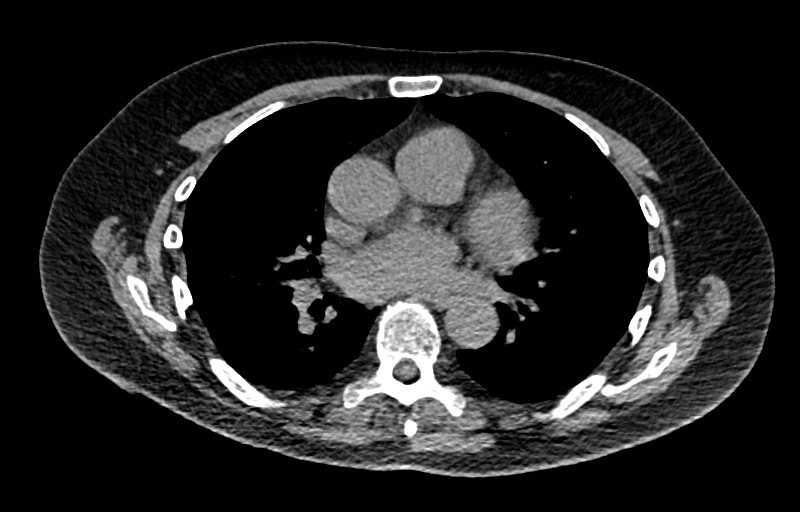
[im 80/148  lung]
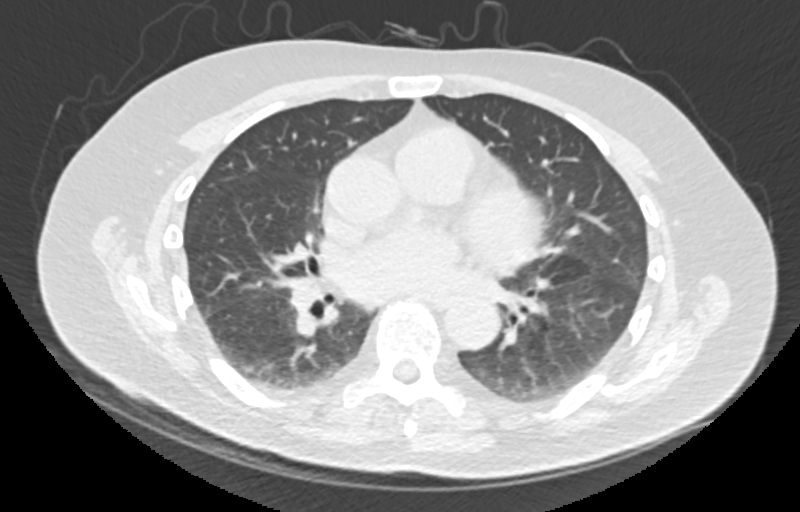
[im 102/148  lung]
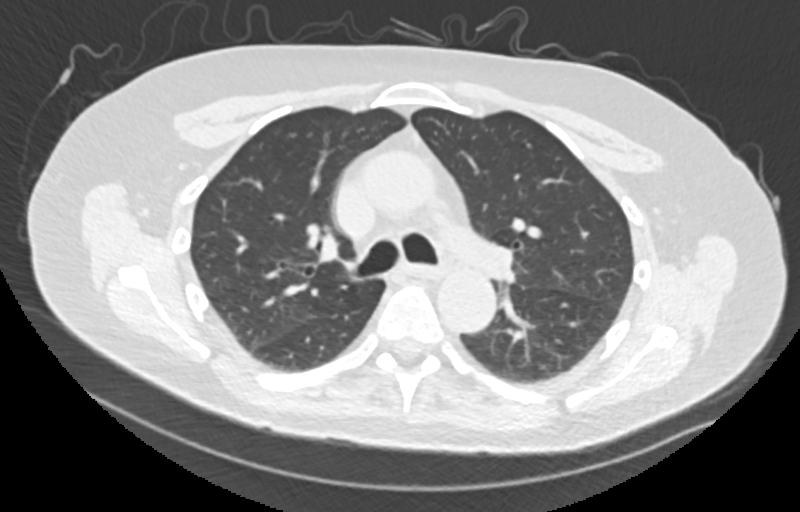
[im 114/148  lung]
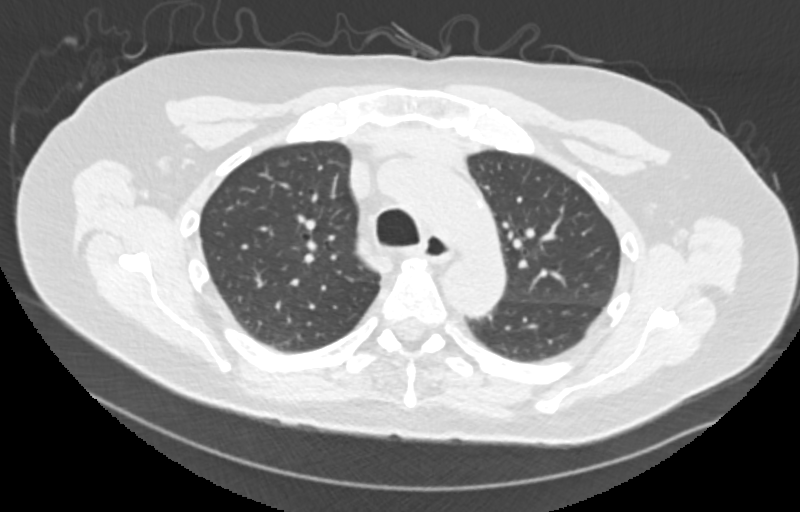
[im 136/148  lung]
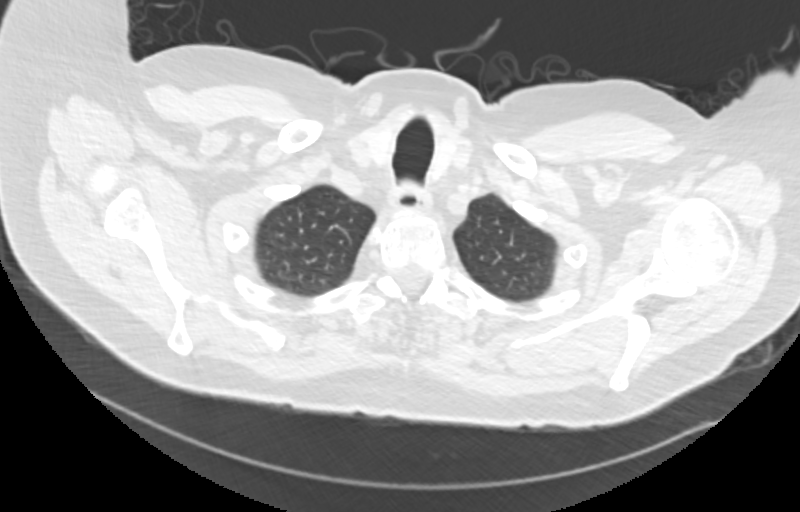

[Series 4: chest 2.00 br40 s3 · coronal · 0.58mm/px · 3 of 138 slices shown (2 of 2)]
[im 28/138  lung]
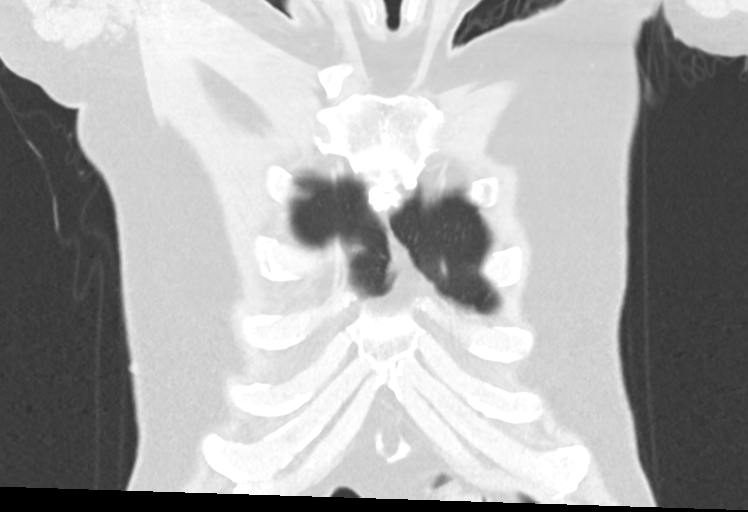
[im 55/138  lung]
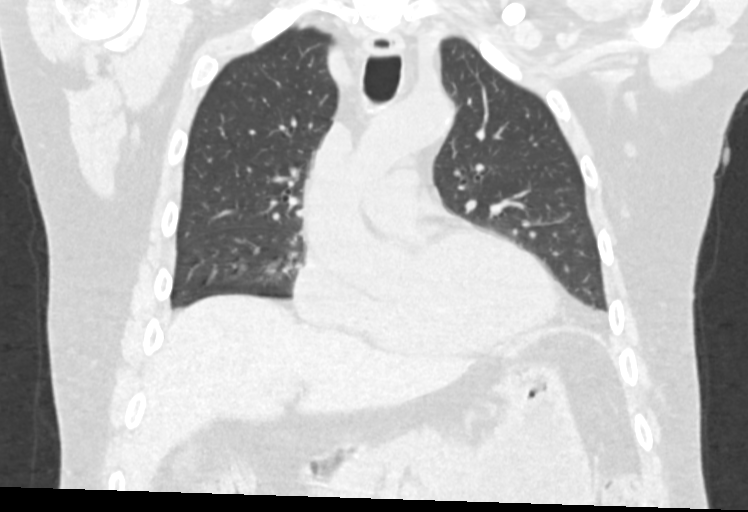
[im 83/138  lung]
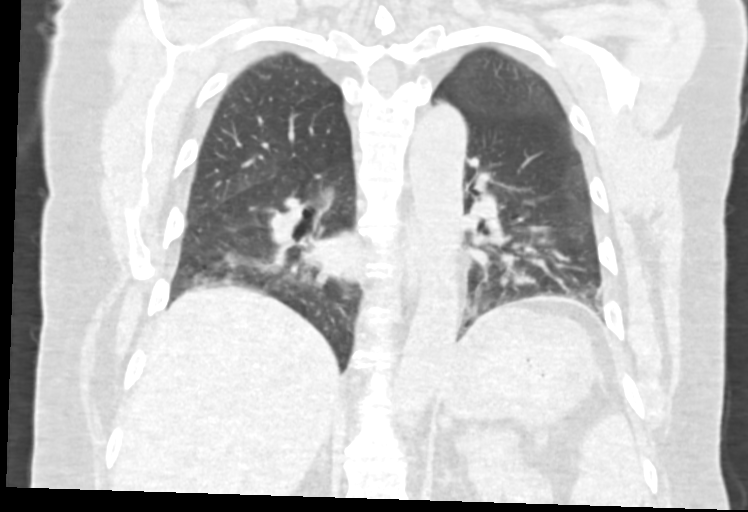

[11 of 36 positions shown; findings below may reference images not displayed]

FINDINGS: Cardiovascular: No significant vascular findings. Normal heart size.
No pericardial effusion. Minimal atherosclerotic calcification
within the thoracic aorta. No aortic aneurysm.

Mediastinum/Nodes: No enlarged mediastinal or axillary lymph nodes.
Thyroid gland, trachea, and esophagus demonstrate no significant
findings.

Lungs/Pleura: 8 mm left lower lobe subpleural pulmonary nodule is
stable, axial image # 82/8. Mild bibasilar atelectasis. No new focal
pulmonary nodules or infiltrates. No pneumothorax or pleural
effusion. Central airways are widely patent.

Upper Abdomen: No acute abnormality. Multiple cysts within the left
hepatic lobe.

Musculoskeletal: No acute bone abnormality. No lytic or blastic bone
lesion.
IMPRESSION: Stable 8 mm left lower lobe pulmonary nodule. CT at 6-12 months
(from today's scan) is considered optional for low-risk patients,
but is recommended for high-risk patients. This recommendation
follows the consensus statement: Guidelines for Management of
Incidental Pulmonary Nodules Detected on CT Images: From the

Aortic Atherosclerosis (OU7S8-YI0.0).

## 2021-11-18 DIAGNOSIS — R159 Full incontinence of feces: Secondary | ICD-10-CM | POA: Diagnosis not present

## 2021-11-18 DIAGNOSIS — F79 Unspecified intellectual disabilities: Secondary | ICD-10-CM | POA: Diagnosis not present

## 2021-11-18 DIAGNOSIS — L304 Erythema intertrigo: Secondary | ICD-10-CM | POA: Diagnosis not present

## 2022-02-08 DIAGNOSIS — D485 Neoplasm of uncertain behavior of skin: Secondary | ICD-10-CM | POA: Diagnosis not present

## 2022-02-08 DIAGNOSIS — L718 Other rosacea: Secondary | ICD-10-CM | POA: Diagnosis not present

## 2022-02-08 DIAGNOSIS — L2089 Other atopic dermatitis: Secondary | ICD-10-CM | POA: Diagnosis not present

## 2022-02-08 DIAGNOSIS — L218 Other seborrheic dermatitis: Secondary | ICD-10-CM | POA: Diagnosis not present

## 2022-03-25 DIAGNOSIS — E785 Hyperlipidemia, unspecified: Secondary | ICD-10-CM | POA: Diagnosis not present

## 2022-03-25 DIAGNOSIS — E0590 Thyrotoxicosis, unspecified without thyrotoxic crisis or storm: Secondary | ICD-10-CM | POA: Diagnosis not present

## 2022-03-25 DIAGNOSIS — R5383 Other fatigue: Secondary | ICD-10-CM | POA: Diagnosis not present

## 2022-03-25 DIAGNOSIS — R7301 Impaired fasting glucose: Secondary | ICD-10-CM | POA: Diagnosis not present

## 2022-04-01 DIAGNOSIS — Z0000 Encounter for general adult medical examination without abnormal findings: Secondary | ICD-10-CM | POA: Diagnosis not present

## 2022-04-01 DIAGNOSIS — I444 Left anterior fascicular block: Secondary | ICD-10-CM | POA: Diagnosis not present

## 2022-04-01 DIAGNOSIS — R82998 Other abnormal findings in urine: Secondary | ICD-10-CM | POA: Diagnosis not present

## 2022-04-01 DIAGNOSIS — Z1212 Encounter for screening for malignant neoplasm of rectum: Secondary | ICD-10-CM | POA: Diagnosis not present

## 2022-04-01 DIAGNOSIS — E785 Hyperlipidemia, unspecified: Secondary | ICD-10-CM | POA: Diagnosis not present

## 2022-04-01 DIAGNOSIS — F79 Unspecified intellectual disabilities: Secondary | ICD-10-CM | POA: Diagnosis not present

## 2022-04-15 DIAGNOSIS — H02881 Meibomian gland dysfunction right upper eyelid: Secondary | ICD-10-CM | POA: Diagnosis not present

## 2022-05-12 DIAGNOSIS — R350 Frequency of micturition: Secondary | ICD-10-CM | POA: Diagnosis not present

## 2022-05-12 DIAGNOSIS — N401 Enlarged prostate with lower urinary tract symptoms: Secondary | ICD-10-CM | POA: Diagnosis not present

## 2022-07-29 DIAGNOSIS — D225 Melanocytic nevi of trunk: Secondary | ICD-10-CM | POA: Diagnosis not present

## 2022-07-29 DIAGNOSIS — L218 Other seborrheic dermatitis: Secondary | ICD-10-CM | POA: Diagnosis not present

## 2022-07-29 DIAGNOSIS — D485 Neoplasm of uncertain behavior of skin: Secondary | ICD-10-CM | POA: Diagnosis not present

## 2022-07-29 DIAGNOSIS — L821 Other seborrheic keratosis: Secondary | ICD-10-CM | POA: Diagnosis not present

## 2022-07-29 DIAGNOSIS — L814 Other melanin hyperpigmentation: Secondary | ICD-10-CM | POA: Diagnosis not present

## 2022-10-29 DIAGNOSIS — F79 Unspecified intellectual disabilities: Secondary | ICD-10-CM | POA: Diagnosis not present

## 2022-10-29 DIAGNOSIS — I444 Left anterior fascicular block: Secondary | ICD-10-CM | POA: Diagnosis not present

## 2022-10-29 DIAGNOSIS — R7301 Impaired fasting glucose: Secondary | ICD-10-CM | POA: Diagnosis not present

## 2022-10-29 DIAGNOSIS — Z23 Encounter for immunization: Secondary | ICD-10-CM | POA: Diagnosis not present

## 2022-10-29 DIAGNOSIS — E0590 Thyrotoxicosis, unspecified without thyrotoxic crisis or storm: Secondary | ICD-10-CM | POA: Diagnosis not present

## 2022-11-04 DIAGNOSIS — K080 Exfoliation of teeth due to systemic causes: Secondary | ICD-10-CM | POA: Diagnosis not present

## 2022-12-29 DIAGNOSIS — L408 Other psoriasis: Secondary | ICD-10-CM | POA: Diagnosis not present

## 2022-12-29 DIAGNOSIS — L218 Other seborrheic dermatitis: Secondary | ICD-10-CM | POA: Diagnosis not present

## 2022-12-29 DIAGNOSIS — L718 Other rosacea: Secondary | ICD-10-CM | POA: Diagnosis not present

## 2023-02-07 DIAGNOSIS — H10412 Chronic giant papillary conjunctivitis, left eye: Secondary | ICD-10-CM | POA: Diagnosis not present

## 2023-02-28 DIAGNOSIS — K080 Exfoliation of teeth due to systemic causes: Secondary | ICD-10-CM | POA: Diagnosis not present

## 2023-05-16 DIAGNOSIS — K080 Exfoliation of teeth due to systemic causes: Secondary | ICD-10-CM | POA: Diagnosis not present

## 2023-05-17 DIAGNOSIS — Z125 Encounter for screening for malignant neoplasm of prostate: Secondary | ICD-10-CM | POA: Diagnosis not present

## 2023-05-17 DIAGNOSIS — E785 Hyperlipidemia, unspecified: Secondary | ICD-10-CM | POA: Diagnosis not present

## 2023-05-17 DIAGNOSIS — R7301 Impaired fasting glucose: Secondary | ICD-10-CM | POA: Diagnosis not present

## 2023-05-17 DIAGNOSIS — E0590 Thyrotoxicosis, unspecified without thyrotoxic crisis or storm: Secondary | ICD-10-CM | POA: Diagnosis not present

## 2023-05-24 DIAGNOSIS — Z0000 Encounter for general adult medical examination without abnormal findings: Secondary | ICD-10-CM | POA: Diagnosis not present

## 2023-05-24 DIAGNOSIS — Z1331 Encounter for screening for depression: Secondary | ICD-10-CM | POA: Diagnosis not present

## 2023-05-24 DIAGNOSIS — I700 Atherosclerosis of aorta: Secondary | ICD-10-CM | POA: Diagnosis not present

## 2023-05-24 DIAGNOSIS — Z23 Encounter for immunization: Secondary | ICD-10-CM | POA: Diagnosis not present

## 2023-05-26 DIAGNOSIS — N401 Enlarged prostate with lower urinary tract symptoms: Secondary | ICD-10-CM | POA: Diagnosis not present

## 2023-05-26 DIAGNOSIS — R350 Frequency of micturition: Secondary | ICD-10-CM | POA: Diagnosis not present

## 2023-05-30 ENCOUNTER — Other Ambulatory Visit: Payer: Self-pay | Admitting: Internal Medicine

## 2023-05-30 DIAGNOSIS — R911 Solitary pulmonary nodule: Secondary | ICD-10-CM

## 2023-06-24 ENCOUNTER — Ambulatory Visit
Admission: RE | Admit: 2023-06-24 | Discharge: 2023-06-24 | Disposition: A | Discharge status: Home / Self Care | Payer: Medicare Other | Source: Ambulatory Visit | Attending: Internal Medicine | Admitting: Internal Medicine

## 2023-06-24 DIAGNOSIS — R911 Solitary pulmonary nodule: Secondary | ICD-10-CM

## 2023-06-24 DIAGNOSIS — J984 Other disorders of lung: Secondary | ICD-10-CM | POA: Diagnosis not present

## 2023-08-04 DIAGNOSIS — L718 Other rosacea: Secondary | ICD-10-CM | POA: Diagnosis not present

## 2023-08-04 DIAGNOSIS — D225 Melanocytic nevi of trunk: Secondary | ICD-10-CM | POA: Diagnosis not present

## 2023-08-04 DIAGNOSIS — L218 Other seborrheic dermatitis: Secondary | ICD-10-CM | POA: Diagnosis not present

## 2023-08-04 DIAGNOSIS — L408 Other psoriasis: Secondary | ICD-10-CM | POA: Diagnosis not present

## 2023-09-16 DIAGNOSIS — H01001 Unspecified blepharitis right upper eyelid: Secondary | ICD-10-CM | POA: Diagnosis not present

## 2023-10-10 DIAGNOSIS — K080 Exfoliation of teeth due to systemic causes: Secondary | ICD-10-CM | POA: Diagnosis not present

## 2024-01-02 DIAGNOSIS — R7301 Impaired fasting glucose: Secondary | ICD-10-CM | POA: Diagnosis not present

## 2024-01-02 DIAGNOSIS — I700 Atherosclerosis of aorta: Secondary | ICD-10-CM | POA: Diagnosis not present

## 2024-01-10 DIAGNOSIS — H10023 Other mucopurulent conjunctivitis, bilateral: Secondary | ICD-10-CM | POA: Diagnosis not present

## 2024-01-18 DIAGNOSIS — L821 Other seborrheic keratosis: Secondary | ICD-10-CM | POA: Diagnosis not present

## 2024-01-18 DIAGNOSIS — L111 Transient acantholytic dermatosis [Grover]: Secondary | ICD-10-CM | POA: Diagnosis not present

## 2024-01-18 DIAGNOSIS — D1801 Hemangioma of skin and subcutaneous tissue: Secondary | ICD-10-CM | POA: Diagnosis not present

## 2024-01-18 DIAGNOSIS — L728 Other follicular cysts of the skin and subcutaneous tissue: Secondary | ICD-10-CM | POA: Diagnosis not present

## 2024-02-22 ENCOUNTER — Other Ambulatory Visit (HOSPITAL_COMMUNITY): Payer: Self-pay | Admitting: Family Medicine

## 2024-02-22 ENCOUNTER — Ambulatory Visit (HOSPITAL_COMMUNITY)
Admission: RE | Admit: 2024-02-22 | Discharge: 2024-02-22 | Disposition: A | Discharge status: Home / Self Care | Source: Ambulatory Visit | Attending: Vascular Surgery | Admitting: Vascular Surgery

## 2024-02-22 DIAGNOSIS — I739 Peripheral vascular disease, unspecified: Secondary | ICD-10-CM | POA: Diagnosis not present

## 2024-02-22 DIAGNOSIS — R0989 Other specified symptoms and signs involving the circulatory and respiratory systems: Secondary | ICD-10-CM

## 2024-02-22 LAB — VAS US ABI WITH/WO TBI
Left ABI: 1.2
Right ABI: 1.28

## 2024-04-12 DIAGNOSIS — H10023 Other mucopurulent conjunctivitis, bilateral: Secondary | ICD-10-CM | POA: Diagnosis not present

## 2024-05-08 DIAGNOSIS — H10023 Other mucopurulent conjunctivitis, bilateral: Secondary | ICD-10-CM | POA: Diagnosis not present

## 2024-05-24 DIAGNOSIS — R351 Nocturia: Secondary | ICD-10-CM | POA: Diagnosis not present

## 2024-05-24 DIAGNOSIS — N401 Enlarged prostate with lower urinary tract symptoms: Secondary | ICD-10-CM | POA: Diagnosis not present

## 2024-05-24 DIAGNOSIS — H10023 Other mucopurulent conjunctivitis, bilateral: Secondary | ICD-10-CM | POA: Diagnosis not present

## 2024-05-24 DIAGNOSIS — R3914 Feeling of incomplete bladder emptying: Secondary | ICD-10-CM | POA: Diagnosis not present
